# Patient Record
Sex: Male | Born: 1985 | Hispanic: No | Marital: Single | Smoking: Never smoker
Health system: Southern US, Community
[De-identification: ages and names within clinical notes are randomized; demographics above are authoritative.]

## PROBLEM LIST (undated history)

## (undated) DIAGNOSIS — G43909 Migraine, unspecified, not intractable, without status migrainosus: Secondary | ICD-10-CM

## (undated) DIAGNOSIS — K219 Gastro-esophageal reflux disease without esophagitis: Secondary | ICD-10-CM

## (undated) DIAGNOSIS — J45909 Unspecified asthma, uncomplicated: Secondary | ICD-10-CM

## (undated) DIAGNOSIS — J358 Other chronic diseases of tonsils and adenoids: Secondary | ICD-10-CM

## (undated) HISTORY — PX: OTHER SURGICAL HISTORY: SHX169

---
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
## 2006-03-14 ENCOUNTER — Emergency Department (HOSPITAL_COMMUNITY): Admission: EM | Admit: 2006-03-14 | Discharge: 2006-03-15 | Payer: Self-pay | Admitting: Emergency Medicine

## 2006-12-22 ENCOUNTER — Emergency Department (HOSPITAL_COMMUNITY): Admission: EM | Admit: 2006-12-22 | Discharge: 2006-12-22 | Payer: Self-pay | Admitting: Emergency Medicine

## 2007-07-26 ENCOUNTER — Emergency Department (HOSPITAL_COMMUNITY): Admission: EM | Admit: 2007-07-26 | Discharge: 2007-07-26 | Payer: Self-pay | Admitting: Emergency Medicine

## 2008-05-11 ENCOUNTER — Emergency Department (HOSPITAL_COMMUNITY): Admission: EM | Admit: 2008-05-11 | Discharge: 2008-05-11 | Payer: Self-pay | Admitting: Emergency Medicine

## 2008-07-11 ENCOUNTER — Emergency Department (HOSPITAL_COMMUNITY): Admission: EM | Admit: 2008-07-11 | Discharge: 2008-07-12 | Payer: Self-pay | Admitting: Emergency Medicine

## 2008-08-21 ENCOUNTER — Emergency Department (HOSPITAL_COMMUNITY): Admission: EM | Admit: 2008-08-21 | Discharge: 2008-08-21 | Payer: Self-pay | Admitting: Emergency Medicine

## 2008-08-21 ENCOUNTER — Emergency Department (HOSPITAL_COMMUNITY): Admission: EM | Admit: 2008-08-21 | Discharge: 2008-08-22 | Payer: Self-pay | Admitting: Internal Medicine

## 2010-06-11 LAB — POCT I-STAT, CHEM 8
BUN: 16 mg/dL (ref 6–23)
Chloride: 108 mEq/L (ref 96–112)
Potassium: 3.5 mEq/L (ref 3.5–5.1)

## 2010-06-11 LAB — POCT CARDIAC MARKERS
CKMB, poc: <1 ng/mL — ABNORMAL LOW (ref 1.0–8.0)
Myoglobin, poc: 38.4 ng/mL (ref 12–200)

## 2010-06-11 LAB — RAPID URINE DRUG SCREEN, HOSP PERFORMED
Amphetamines: NOT DETECTED
Barbiturates: NOT DETECTED
Cocaine: NOT DETECTED
Opiates: NOT DETECTED
Tetrahydrocannabinol: POSITIVE — AB

## 2010-12-12 LAB — URINALYSIS, ROUTINE W REFLEX MICROSCOPIC
Hgb urine dipstick: NEGATIVE
Ketones, ur: NEGATIVE
Protein, ur: NEGATIVE
Specific Gravity, Urine: >1.03 — ABNORMAL HIGH
Urobilinogen, UA: 0.2
pH: 6

## 2010-12-12 LAB — DIFFERENTIAL
Basophils Absolute: 0
Basophils Relative: 0
Lymphocytes Relative: 20
Monocytes Relative: 10
Neutro Abs: 5.4

## 2010-12-12 LAB — COMPREHENSIVE METABOLIC PANEL
Alkaline Phosphatase: 62
BUN: 9
Calcium: 9.2
Chloride: 101
GFR calc Af Amer: >60
Glucose, Bld: 102 — ABNORMAL HIGH
Potassium: 3.5
Sodium: 135

## 2010-12-12 LAB — CBC
HCT: 43.8
MCHC: 33.5
MCV: 79.2
Platelets: 279

## 2011-02-04 IMAGING — CR DG CHEST 2V
2 series · 2 of 2 positions shown · non-contrast
Comparison: 07/11/2008

CLINICAL DATA: Chest pain

CHEST - 2 VIEW

[w chest pa]
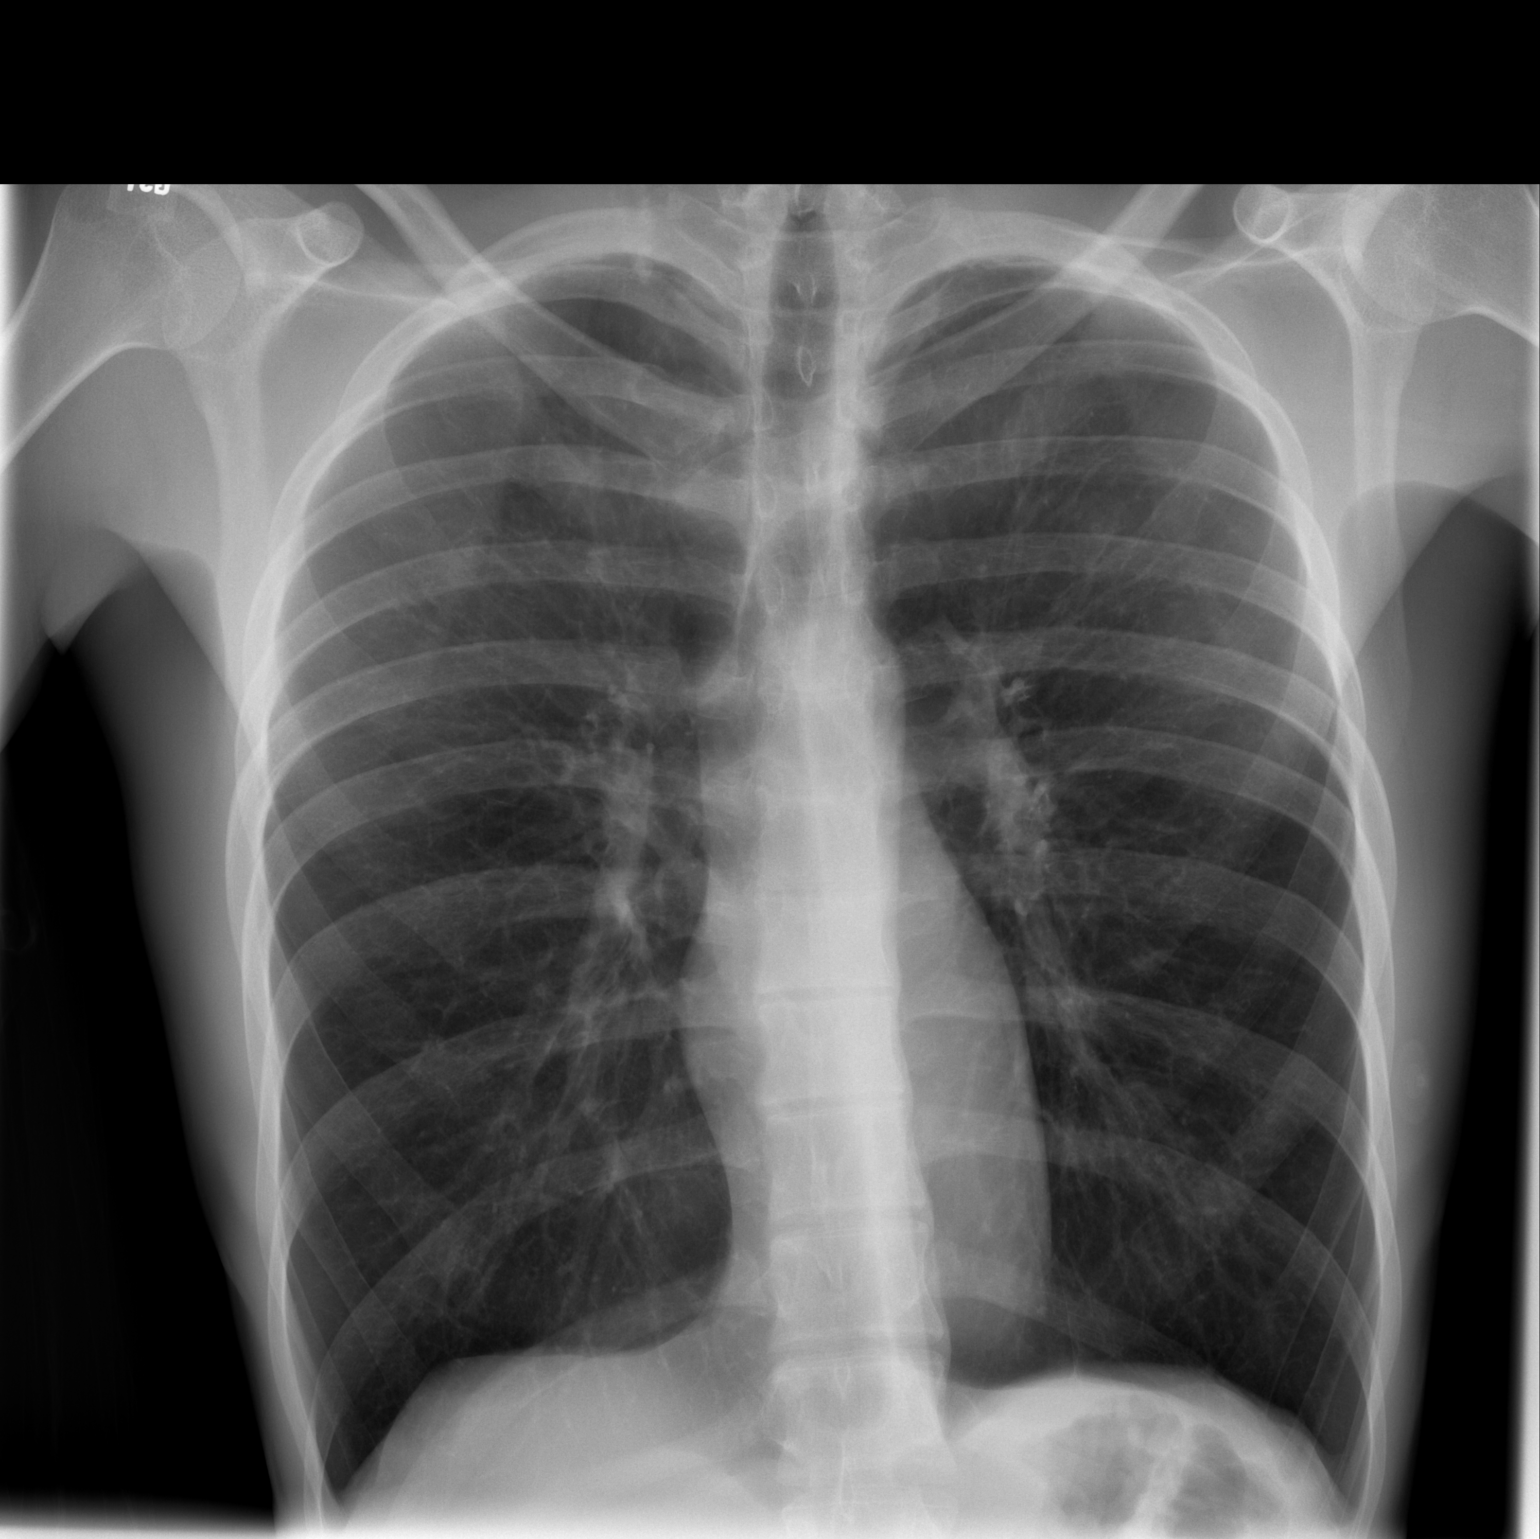

[w chest lat]
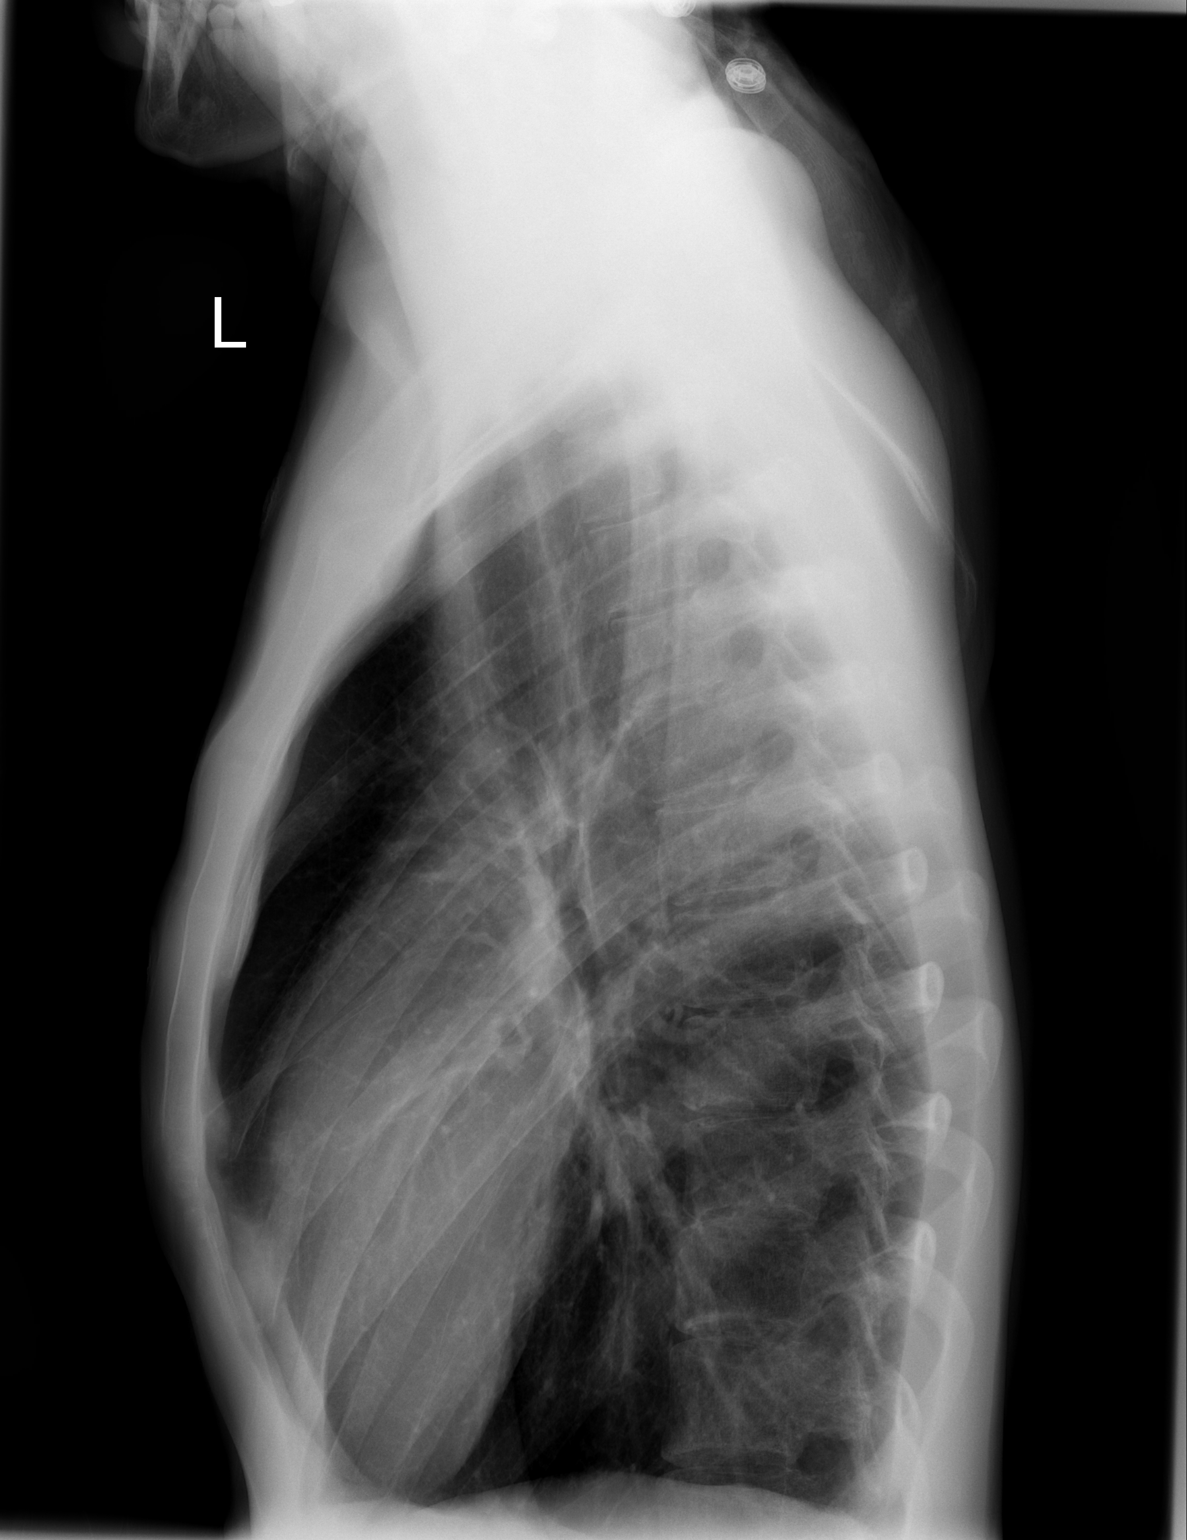

[2 of 2 positions shown; findings below may reference images not displayed]

FINDINGS: Marked hyperaeration.  Bronchitic changes.  No new
consolidation or mass.  No pneumothorax or pleural effusions.  The
heart is normal in size.
IMPRESSION: Stable hyperaeration and bronchitic changes.

## 2012-10-06 ENCOUNTER — Encounter (HOSPITAL_COMMUNITY): Payer: Self-pay

## 2012-10-06 ENCOUNTER — Emergency Department (HOSPITAL_COMMUNITY)
Admission: EM | Admit: 2012-10-06 | Discharge: 2012-10-06 | Disposition: A | Discharge status: Home / Self Care | Payer: Medicaid Other | Attending: Emergency Medicine | Admitting: Emergency Medicine

## 2012-10-06 DIAGNOSIS — N342 Other urethritis: Secondary | ICD-10-CM | POA: Insufficient documentation

## 2012-10-06 DIAGNOSIS — F17200 Nicotine dependence, unspecified, uncomplicated: Secondary | ICD-10-CM | POA: Insufficient documentation

## 2012-10-06 DIAGNOSIS — R3989 Other symptoms and signs involving the genitourinary system: Secondary | ICD-10-CM | POA: Insufficient documentation

## 2012-10-06 DIAGNOSIS — J45909 Unspecified asthma, uncomplicated: Secondary | ICD-10-CM | POA: Insufficient documentation

## 2012-10-06 HISTORY — DX: Unspecified asthma, uncomplicated: J45.909

## 2012-10-06 LAB — URINALYSIS, ROUTINE W REFLEX MICROSCOPIC
Bilirubin Urine: NEGATIVE
Glucose, UA: NEGATIVE mg/dL
Ketones, ur: NEGATIVE mg/dL
Leukocytes, UA: NEGATIVE
Nitrite: NEGATIVE

## 2012-10-06 MED ORDER — DOXYCYCLINE HYCLATE 100 MG PO CAPS: 100.0000 mg | ORAL_CAPSULE | Freq: Two times a day (BID) | ORAL | Status: DC

## 2012-10-06 NOTE — ED Provider Notes (Signed)
  CSN: [id number]     Arrival date & time 10/06/12  1431 History     None    Chief Complaint  Patient presents with  . Penile Discharge   (Consider location/radiation/quality/duration/timing/severity/associated sxs/prior Treatment) Patient is a 27 y.o. male presenting with penile discharge. The history is provided by the patient. No language interpreter was used.  Penile Discharge This is a new problem. The current episode started today. The problem occurs constantly. The problem has been unchanged. Nothing aggravates the symptoms. He has tried nothing for the symptoms. The treatment provided mild relief.  Pt has clear discharge,  Negative gc and ct [facility] on [facility].  Burning with urination  Past Medical History  Diagnosis Date  . Asthma    History reviewed. No pertinent past surgical history. No family history on file. History  Substance Use Topics  . Smoking status: Current Some Day Smoker    Types: Cigarettes  . Smokeless tobacco: Not on file  . Alcohol Use: Yes    Review of Systems  Genitourinary: Positive for discharge.  All other systems reviewed and are negative.    Allergies  Review of patient's allergies indicates no known allergies.  Home Medications   Current Outpatient Rx  Name  Route  Sig  Dispense  Refill  . albuterol (PROVENTIL HFA;VENTOLIN HFA) 108 (90 BASE) MCG/ACT inhaler   Inhalation   Inhale 2 puffs into the lungs every 6 (six) hours as needed for wheezing.         Marland Kitchen ibuprofen (ADVIL,MOTRIN) 200 MG tablet   Oral   Take 600 mg by mouth every 8 (eight) hours as needed for pain.          BP 138/88  Pulse 70  Temp(Src) 97.8 F (36.6 C) (Oral)  Resp 18  Ht 6' (1.829 m)  Wt 125 lb (56.7 kg)  BMI 16.95 kg/m2  SpO2 100% Physical Exam  Nursing note and vitals reviewed. Constitutional: He appears well-developed and well-nourished.  HENT:  Head: Normocephalic and atraumatic.  Right Ear: External ear normal.  Left Ear: External ear  normal.  Mouth/Throat: Oropharynx is clear and moist.  Eyes: Pupils are equal, round, and reactive to light.  Cardiovascular: Normal rate.   Pulmonary/Chest: Effort normal.  Abdominal: Soft.  Skin: Skin is warm.  Psychiatric: He has a normal mood and affect.    ED Course   Procedures (including critical care time)  Labs Reviewed - No data to display No results found. 1. Urethritis     MDM  Doxycycline   See the urologist if symptoms persit  Elson Areas, PA-C 10/06/12 1554

## 2012-10-06 NOTE — ED Notes (Signed)
Pt reports clear penile discharge for 5 days, thinks he may have gotten infection from "peeing in the river". Denies any fever/chills, no nausea. Was seen at [facility] last week and checked for std.

## 2012-10-06 NOTE — ED Notes (Signed)
Pt reports dysuria and clear penile discharge x1 week. Pt states he was swimming in river and urinated in the water and is worried about infection. Pt states he was seen at [facility] department for STD check and was told all tests were negative.

## 2012-10-07 NOTE — ED Provider Notes (Signed)
Medical screening examination/treatment/procedure(s) were performed by non-physician practitioner and as supervising physician I was immediately available for consultation/collaboration.  Flint Melter, MD 10/07/12 [phone]

## 2013-01-18 ENCOUNTER — Emergency Department (HOSPITAL_COMMUNITY)
Admission: EM | Admit: 2013-01-18 | Discharge: 2013-01-18 | Disposition: A | Discharge status: Home / Self Care | Payer: Medicaid Other | Attending: Emergency Medicine | Admitting: Emergency Medicine

## 2013-01-18 ENCOUNTER — Encounter (HOSPITAL_COMMUNITY): Payer: Self-pay | Admitting: Emergency Medicine

## 2013-01-18 DIAGNOSIS — J45909 Unspecified asthma, uncomplicated: Secondary | ICD-10-CM | POA: Insufficient documentation

## 2013-01-18 DIAGNOSIS — K029 Dental caries, unspecified: Secondary | ICD-10-CM | POA: Insufficient documentation

## 2013-01-18 DIAGNOSIS — R21 Rash and other nonspecific skin eruption: Secondary | ICD-10-CM | POA: Insufficient documentation

## 2013-01-18 DIAGNOSIS — Z79899 Other long term (current) drug therapy: Secondary | ICD-10-CM | POA: Insufficient documentation

## 2013-01-18 DIAGNOSIS — K130 Diseases of lips: Secondary | ICD-10-CM

## 2013-01-18 MED ORDER — CHLORHEXIDINE GLUCONATE 0.12 % MT SOLN: 15.0000 mL | Freq: Two times a day (BID) | OROMUCOSAL | Status: DC

## 2013-01-18 NOTE — ED Notes (Signed)
Pt says "rash" on lips that is getting worse. Pts doctor advised him to see dermatologist,Has appt for January.  Using carmax

## 2013-01-18 NOTE — ED Notes (Signed)
Pt states rash to corners of mouth which is beginning to spread on lip. Pt states he has been using caramex to the areas. First noticed symptoms in July.

## 2013-01-19 NOTE — ED Provider Notes (Signed)
CSN: [id number]     Arrival date & time 01/18/13  1055 History   First MD Initiated Contact with Patient 01/18/13 1111     Chief Complaint  Patient presents with  . mouth pain    (Consider location/radiation/quality/duration/timing/severity/associated sxs/prior Treatment) HPI Comments: Richard Clarke is a 27 y.o. male who presents to the Emergency Department complaining of persistent rash to the corners of his mouth and across his upper lip.  States sx's have been present for 4 months.  He has been using Carmex to his lips w/o improvement.  He denies fever, swelling, difficulty swallowing or breathing.  The history is provided by the patient.    Past Medical History  Diagnosis Date  . Asthma    History reviewed. No pertinent past surgical history. No family history on file. History  Substance Use Topics  . Smoking status: Never Smoker   . Smokeless tobacco: Not on file  . Alcohol Use: Yes     Comment: Occ    Review of Systems  Constitutional: Negative for fever and appetite change.  HENT: Positive for mouth sores. Negative for congestion, dental problem, facial swelling, sore throat and trouble swallowing.   Eyes: Negative for pain and visual disturbance.  Musculoskeletal: Negative for neck pain and neck stiffness.  Neurological: Negative for dizziness, facial asymmetry and headaches.  Hematological: Negative for adenopathy.  All other systems reviewed and are negative.    Allergies  Review of patient's allergies indicates no known allergies.  Home Medications   Current Outpatient Rx  Name  Route  Sig  Dispense  Refill  . albuterol (PROVENTIL HFA;VENTOLIN HFA) 108 (90 BASE) MCG/ACT inhaler   Inhalation   Inhale 2 puffs into the lungs every 6 (six) hours as needed for wheezing.         . chlorhexidine (PERIDEX) 0.12 % solution   Mouth/Throat   Use as directed 15 mLs in the mouth or throat 2 (two) times daily. Rinse for 30 seconds before spitting it out.   120  mL   0   . doxycycline (VIBRAMYCIN) 100 MG capsule   Oral   Take 1 capsule (100 mg total) by mouth 2 (two) times daily.   20 capsule   0   . ibuprofen (ADVIL,MOTRIN) 200 MG tablet   Oral   Take 600 mg by mouth every 8 (eight) hours as needed for pain.          BP 124/68  Pulse 57  Temp(Src) 98.7 F (37.1 C) (Oral)  Resp 16  SpO2 100% Physical Exam  Nursing note and vitals reviewed. Constitutional: He is oriented to person, place, and time. He appears well-developed and well-nourished. No distress.  HENT:  Head: Normocephalic and atraumatic.  Right Ear: Tympanic membrane and ear canal normal.  Left Ear: Tympanic membrane and ear canal normal.  Mouth/Throat: Uvula is midline, oropharynx is clear and moist and mucous membranes are normal. No trismus in the jaw. Dental caries present. No dental abscesses or uvula swelling.  Very scant lesions to the upper lips and along upper frenulum.  No erythema.  No oral lesions.  No wounds or erythema of the oral commissures.    No facial swelling, or obvious dental abscess  Neck: Normal range of motion. Neck supple.  Cardiovascular: Normal rate, regular rhythm and normal heart sounds.   No murmur heard. Pulmonary/Chest: Effort normal and breath sounds normal. No respiratory distress.  Musculoskeletal: Normal range of motion.  Lymphadenopathy:    He has no  cervical adenopathy.  Neurological: He is alert and oriented to person, place, and time. He exhibits normal muscle tone. Coordination normal.  Skin: Skin is warm and dry.    ED Course  Procedures (including critical care time) Labs Review Labs Reviewed - No data to display Imaging Review No results found.  EKG Interpretation   None       MDM   1. Angular cheilitis    Will have pt stop the carmex, referral for dermatology.  peridex mouth wash.  Pt is well appearing and stable for d/c    Tonia Avino L. Trisha Mangle, PA-C 01/19/13 2123

## 2013-01-21 NOTE — ED Provider Notes (Signed)
Medical screening examination/treatment/procedure(s) were performed by non-physician practitioner and as supervising physician I was immediately available for consultation/collaboration.  EKG Interpretation   None         Laurissa Cowper W Arnav Cregg, MD 01/21/13 1603 

## 2013-12-27 ENCOUNTER — Emergency Department (HOSPITAL_COMMUNITY)
Admission: EM | Admit: 2013-12-27 | Discharge: 2013-12-27 | Disposition: A | Discharge status: Home / Self Care | Payer: Medicaid Other | Attending: Emergency Medicine | Admitting: Emergency Medicine

## 2013-12-27 ENCOUNTER — Encounter (HOSPITAL_COMMUNITY): Payer: Self-pay | Admitting: Emergency Medicine

## 2013-12-27 DIAGNOSIS — J029 Acute pharyngitis, unspecified: Secondary | ICD-10-CM | POA: Insufficient documentation

## 2013-12-27 DIAGNOSIS — J45909 Unspecified asthma, uncomplicated: Secondary | ICD-10-CM | POA: Insufficient documentation

## 2013-12-27 MED ORDER — PENICILLIN V POTASSIUM 500 MG PO TABS: 500.0000 mg | ORAL_TABLET | Freq: Four times a day (QID) | ORAL | Status: AC

## 2013-12-27 MED ORDER — TRAMADOL HCL 50 MG PO TABS: 50.0000 mg | ORAL_TABLET | Freq: Four times a day (QID) | ORAL | Status: DC | PRN

## 2013-12-27 NOTE — ED Provider Notes (Signed)
CSN: [id number]     Arrival date & time 12/27/13  1759 History  This chart was scribed for non-physician practitioner Kerrie BuffaloHope Neese, NP, working with Vida RollerBrian D Miller, MD by Littie Deedsichard Sun, ED Scribe. This patient was seen in room [facility]FT21/[facility] and the patient's care was started at 6:41 PM.      Chief Complaint  Patient presents with  . Sore Throat    Patient is a 28 y.o. male presenting with pharyngitis. The history is provided by the patient. No language interpreter was used.  Sore Throat This is a new problem. The current episode started yesterday. The problem occurs constantly. The problem has been gradually worsening. Pertinent negatives include no chest pain, no abdominal pain and no headaches. The symptoms are aggravated by eating. He has tried nothing for the symptoms.  Sore Throat This is a new problem. The current episode started yesterday. The problem occurs constantly. The problem has been gradually worsening. Associated symptoms include a sore throat and swollen glands. Pertinent negatives include no abdominal pain, chest pain, congestion, coughing, fatigue, headaches, nausea, rash or vomiting. The symptoms are aggravated by eating. He has tried nothing for the symptoms.   HPI Comments: Richard Clarke is a 28 y.o. male who presents to the Emergency Department complaining of gradual onset, constant sore throat that began last night. Patient does report having a couple loose stools . He denies fever, chills, abomdinal pain, constipation, congestion. No allergies to penicillin.   Past Medical History  Diagnosis Date  . Asthma    History reviewed. No pertinent past surgical history. History reviewed. No pertinent family history. History  Substance Use Topics  . Smoking status: Never Smoker   . Smokeless tobacco: Not on file  . Alcohol Use: Yes     Comment: Occ    Review of Systems  Constitutional: Negative for appetite change and fatigue.  HENT: Positive for sore throat.  Negative for congestion, ear discharge, mouth sores, sinus pressure and trouble swallowing.   Eyes: Negative for discharge, redness and visual disturbance.  Respiratory: Negative for cough and wheezing.   Cardiovascular: Negative for chest pain.  Gastrointestinal: Negative for nausea, vomiting, abdominal pain and diarrhea.  Genitourinary: Negative for dysuria, frequency and hematuria.  Musculoskeletal: Negative for back pain.  Skin: Negative for rash.  Neurological: Negative for seizures and headaches.  Psychiatric/Behavioral: Negative for confusion.      Allergies  Review of patient's allergies indicates no known allergies.  Home Medications   Prior to Admission medications   Medication Sig Start Date End Date Taking? Authorizing Provider  albuterol (PROVENTIL HFA;VENTOLIN HFA) 108 (90 BASE) MCG/ACT inhaler Inhale 2 puffs into the lungs every 6 (six) hours as needed for wheezing.    Historical Provider, MD   BP 121/66  Pulse 61  Temp(Src) 98.5 F (36.9 C) (Oral)  Resp 24  Ht 5\' 9"  (1.753 m)  Wt 125 lb (56.7 kg)  BMI 18.45 kg/m2  SpO2 100% Physical Exam  Nursing note and vitals reviewed. Constitutional: He is oriented to person, place, and time. He appears well-developed and well-nourished. No distress.  HENT:  Head: Normocephalic and atraumatic.  Right Ear: Tympanic membrane normal.  Left Ear: Tympanic membrane normal.  Nose: Nose normal.  Mouth/Throat: Uvula is midline and mucous membranes are normal. Posterior oropharyngeal erythema present. No oropharyngeal exudate or tonsillar abscesses.  Tms clear, light reflex present. Uvula midline. No edema. Erythema throat.  Eyes: EOM are normal. Pupils are equal, round, and reactive to light. No scleral  icterus.  Sclera clear.  Neck: Normal range of motion. Neck supple. No spinous process tenderness present.  Anterior cervical adenopathy.  Cardiovascular: Normal rate, regular rhythm and normal heart sounds.   No murmur  heard. Pulmonary/Chest: Effort normal and breath sounds normal. No respiratory distress. He has no wheezes. He has no rales.  Abdominal: Soft. Bowel sounds are normal. There is no tenderness.  Musculoskeletal: Normal range of motion.  No CVA tenderness.  Lymphadenopathy:    He has cervical adenopathy.  Neurological: He is alert and oriented to person, place, and time. No cranial nerve deficit.  Skin: Skin is warm and dry. No rash noted.  Psychiatric: He has a normal mood and affect. His behavior is normal.    ED Course  Procedures  DIAGNOSTIC STUDIES: Oxygen Saturation is 100% on room air, normal by my interpretation.    COORDINATION OF CARE: 6:43 PM-Discussed treatment plan which includes abx and pain medication with pt at bedside and pt agreed to plan.     MDM  28 y.o. male with sore throat, gland swelling and feeling bad. Stable for discharge with symptoms of strep. Will treat with Penicillin. Discussed with the patient if symptoms worsen may consider testing for Mono. Will not test today since symptoms just started. Stable for discharge without neck stiffness or fever.    Medication List    TAKE these medications       penicillin v potassium 500 MG tablet  Commonly known as:  VEETID  Take 1 tablet (500 mg total) by mouth 4 (four) times daily.     traMADol 50 MG tablet  Commonly known as:  ULTRAM  Take 1 tablet (50 mg total) by mouth every 6 (six) hours as needed for moderate pain.      ASK your doctor about these medications       albuterol 108 (90 BASE) MCG/ACT inhaler  Commonly known as:  PROVENTIL HFA;VENTOLIN HFA  Inhale 2 puffs into the lungs every 6 (six) hours as needed for wheezing.         [facility] Orlene OchM Neese, [place]NP 12/27/13 [phone]

## 2013-12-27 NOTE — ED Notes (Signed)
Pt seen and eval by EDNP for initial assessment. 

## 2013-12-27 NOTE — ED Provider Notes (Signed)
Medical screening examination/treatment/procedure(s) were performed by non-physician practitioner and as supervising physician I was immediately available for consultation/collaboration.    Vida RollerBrian D Belvin Gauss, MD 12/27/13 [phone]

## 2013-12-27 NOTE — ED Notes (Signed)
Sore throat since last night. Alert, NAD.

## 2014-10-24 ENCOUNTER — Emergency Department (HOSPITAL_COMMUNITY)
Admission: EM | Admit: 2014-10-24 | Discharge: 2014-10-24 | Disposition: A | Discharge status: Home / Self Care | Payer: Medicaid Other | Attending: Emergency Medicine | Admitting: Emergency Medicine

## 2014-10-24 ENCOUNTER — Encounter (HOSPITAL_COMMUNITY): Payer: Self-pay | Admitting: *Deleted

## 2014-10-24 DIAGNOSIS — R519 Headache, unspecified: Secondary | ICD-10-CM

## 2014-10-24 DIAGNOSIS — J45909 Unspecified asthma, uncomplicated: Secondary | ICD-10-CM | POA: Insufficient documentation

## 2014-10-24 DIAGNOSIS — Z79899 Other long term (current) drug therapy: Secondary | ICD-10-CM | POA: Insufficient documentation

## 2014-10-24 DIAGNOSIS — R51 Headache: Secondary | ICD-10-CM | POA: Insufficient documentation

## 2014-10-24 MED ORDER — MAGIC MOUTHWASH W/LIDOCAINE: 5.0000 mL | Freq: Three times a day (TID) | ORAL | Status: DC | PRN

## 2014-10-24 NOTE — ED Notes (Signed)
Pt c/o rash around mouth and sores in mouth; pt states he went to dermatologist x 3 months ago and was given cream and he states that he does not think it is working; pt also states he feels like it has been affecting his overall health because he doesn't feel well

## 2014-10-24 NOTE — ED Provider Notes (Signed)
CSN: [id number]     Arrival date & time 10/24/14  1922 History  This chart was scribed for Raeford Razor, MD by Phillis Haggis, ED Scribe. This patient was seen in room APA19/[facility]19 and patient care was started at 8:14 PM.   Chief Complaint  Patient presents with  . Rash  The history is provided by the patient. No language interpreter was used.   HPI Comments: Richard Clarke is a 29 y.o. male with a hx of asthma who presents to the Emergency Department complaining of gradually worsening rash around mouth and sores inside mouth onset 4 months ago. He states that there are bumps on the lining of his left upper lip and there are open sores on the insides of his bilateral cheeks. He states that his mouth is constantly burning and itching and will intermittently cause his lips to swell. He states that the inside of his mouth "feels raw." Pt reports seeing his dermatologist 4 months ago and was given an antibiotic and cream but denies relief. He says that the dermatologist did not give him a diagnosis for what the rash was, but said it may be from albuterol inhaler use. Per triage note, pt states that he is not feeling well and believes the rash is affecting his overall health.   Past Medical History  Diagnosis Date  . Asthma    History reviewed. No pertinent past surgical history. History reviewed. No pertinent family history. Social History  Substance Use Topics  . Smoking status: Never Smoker   . Smokeless tobacco: None  . Alcohol Use: Yes     Comment: Occ    Review of Systems 10 Systems reviewed and all are negative for acute change except as noted in the HPI.  Allergies  Review of patient's allergies indicates no known allergies.  Home Medications   Prior to Admission medications   Medication Sig Start Date End Date Taking? Authorizing Provider  albuterol (PROVENTIL HFA;VENTOLIN HFA) 108 (90 BASE) MCG/ACT inhaler Inhale 2 puffs into the lungs every 6 (six) hours as needed for  wheezing.    Historical Provider, MD  traMADol (ULTRAM) 50 MG tablet Take 1 tablet (50 mg total) by mouth every 6 (six) hours as needed for moderate pain. 12/27/13   Hope Orlene Och, NP   BP 129/78 mmHg  Pulse 70  Temp(Src) 98.2 F (36.8 C) (Oral)  Resp 20  Ht  (1.753 m)  Wt 135 lb (61.236 kg)  BMI 19.93 kg/m2  SpO2 99%  Physical Exam  Constitutional: He is oriented to person, place, and time. He appears well-developed and well-nourished.  HENT:  Head: Normocephalic and atraumatic.  Mouth/Throat: Oropharynx is clear and moist.  Face is normal in appearance; no swelling or other skin changes noted  Eyes: EOM are normal.  Neck: Normal range of motion. Neck supple.  Cardiovascular: Normal rate.   Pulmonary/Chest: Effort normal.  Musculoskeletal: Normal range of motion.  Neurological: He is alert and oriented to person, place, and time.  Skin: Skin is warm and dry.  Psychiatric: He has a normal mood and affect. His behavior is normal.  Nursing note and vitals reviewed.   ED Course  Procedures (including critical care time) DIAGNOSTIC STUDIES: Oxygen Saturation is 99% on RA, normal by my interpretation.    COORDINATION OF CARE: 8:22 PM-Discussed treatment plan which includes prescription with pt at bedside and pt agreed to plan.   Labs Review Labs Reviewed - No data to display  Imaging Review No results  found.    EKG Interpretation None      MDM   Final diagnoses:  Facial pain, acute    29yM with facial/oral pain. I do not find anything objective on my exam. No facial swelling. Mild facial acne but otherwise no oral or perioral lesions noted.   I personally preformed the services scribed in my presence. The recorded information has been reviewed is accurate. Raeford Razor, MD.   Raeford Razor, MD 10/26/14 [phone]

## 2014-10-24 NOTE — ED Notes (Signed)
Patient states that he was upset that the doctor came in with a male when he examined him. States that he wasted his time coming here and is going to a different hospital because doctor states he didn't find anything.  Patient left without signing discharge paperwork or getting paper work.

## 2015-08-08 ENCOUNTER — Emergency Department (HOSPITAL_COMMUNITY)
Admission: EM | Admit: 2015-08-08 | Discharge: 2015-08-08 | Disposition: A | Discharge status: Home / Self Care | Payer: 59 | Attending: Emergency Medicine | Admitting: Emergency Medicine

## 2015-08-08 ENCOUNTER — Encounter (HOSPITAL_COMMUNITY): Payer: Self-pay | Admitting: *Deleted

## 2015-08-08 ENCOUNTER — Emergency Department (HOSPITAL_COMMUNITY): Payer: 59

## 2015-08-08 DIAGNOSIS — R1031 Right lower quadrant pain: Secondary | ICD-10-CM | POA: Insufficient documentation

## 2015-08-08 DIAGNOSIS — Z79899 Other long term (current) drug therapy: Secondary | ICD-10-CM | POA: Insufficient documentation

## 2015-08-08 DIAGNOSIS — R109 Unspecified abdominal pain: Secondary | ICD-10-CM | POA: Diagnosis present

## 2015-08-08 DIAGNOSIS — R1032 Left lower quadrant pain: Secondary | ICD-10-CM | POA: Insufficient documentation

## 2015-08-08 DIAGNOSIS — J45909 Unspecified asthma, uncomplicated: Secondary | ICD-10-CM | POA: Insufficient documentation

## 2015-08-08 LAB — COMPREHENSIVE METABOLIC PANEL
ALBUMIN: 3.9 g/dL (ref 3.5–5.0)
ALK PHOS: 40 U/L (ref 38–126)
ALT: 15 U/L — ABNORMAL LOW (ref 17–63)
ANION GAP: 5 (ref 5–15)
AST: 22 U/L (ref 15–41)
BUN: 10 mg/dL (ref 6–20)
CALCIUM: 9.4 mg/dL (ref 8.9–10.3)
CO2: 26 mmol/L (ref 22–32)
Chloride: 107 mmol/L (ref 101–111)
Creatinine, Ser: 1.04 mg/dL (ref 0.61–1.24)
GFR calc non Af Amer: >60 mL/min (ref >60)
GLUCOSE: 98 mg/dL (ref 65–99)
POTASSIUM: 3.7 mmol/L (ref 3.5–5.1)
SODIUM: 138 mmol/L (ref 135–145)
Total Bilirubin: 0.8 mg/dL (ref 0.3–1.2)
Total Protein: 6.9 g/dL (ref 6.5–8.1)

## 2015-08-08 LAB — LIPASE, BLOOD: LIPASE: 24 U/L (ref 11–51)

## 2015-08-08 LAB — CBC
HEMATOCRIT: 41.8 % (ref 39.0–52.0)
HEMOGLOBIN: 13.4 g/dL (ref 13.0–17.0)
MCH: 26.3 pg (ref 26.0–34.0)
MCHC: 32.1 g/dL (ref 30.0–36.0)
MCV: 82 fL (ref 78.0–100.0)
Platelets: 266 10*3/uL (ref 150–400)
RBC: 5.1 MIL/uL (ref 4.22–5.81)
RDW: 13.4 % (ref 11.5–15.5)
WBC: 8.8 10*3/uL (ref 4.0–10.5)

## 2015-08-08 LAB — URINALYSIS, ROUTINE W REFLEX MICROSCOPIC
Bilirubin Urine: NEGATIVE
Glucose, UA: NEGATIVE mg/dL
Hgb urine dipstick: NEGATIVE
Ketones, ur: NEGATIVE mg/dL
Leukocytes, UA: NEGATIVE
NITRITE: NEGATIVE
PH: 6 (ref 5.0–8.0)
Protein, ur: NEGATIVE mg/dL
SPECIFIC GRAVITY, URINE: 1.024 (ref 1.005–1.030)

## 2015-08-08 MED ORDER — IOPAMIDOL (ISOVUE-300) INJECTION 61%
INTRAVENOUS | Status: AC
  Administered 2015-08-08: 100 mL
  Filled 2015-08-08: qty 100

## 2015-08-08 MED ORDER — ONDANSETRON HCL 4 MG/2ML IJ SOLN
4.0000 mg | Freq: Once | INTRAMUSCULAR | Status: AC
  Administered 2015-08-08: 4 mg via INTRAVENOUS
  Filled 2015-08-08: qty 2

## 2015-08-08 MED ORDER — SODIUM CHLORIDE 0.9 % IV BOLUS (SEPSIS)
1000.0000 mL | Freq: Once | INTRAVENOUS | Status: AC
  Administered 2015-08-08: 1000 mL via INTRAVENOUS

## 2015-08-08 MED ORDER — MORPHINE SULFATE (PF) 4 MG/ML IV SOLN
4.0000 mg | Freq: Once | INTRAVENOUS | Status: AC
  Administered 2015-08-08: 4 mg via INTRAVENOUS
  Filled 2015-08-08: qty 1

## 2015-08-08 NOTE — ED Notes (Signed)
Brought patient back to room; patient undressed, in gown, on continuous pulse oximetry and blood pressure cuff 

## 2015-08-08 NOTE — ED Notes (Signed)
Pt is here with LLQ pain for 3 months intermittently and now constant.  Reports some diarrhea

## 2015-08-08 NOTE — Discharge Instructions (Signed)

## 2015-08-08 NOTE — ED Provider Notes (Signed)
CSN: [id number]     Arrival date & time 08/08/15  1300 History   First MD Initiated Contact with Patient 08/08/15 1556     Chief Complaint  Patient presents with  . Abdominal Pain     (Consider location/radiation/quality/duration/timing/severity/associated sxs/prior Treatment) HPI Comments: Patient presents for abdominal pain. He reports pain to his left lower quadrant off and on for about the last 4 months. He states it normally happens once or twice a week. It normally only last a couple of hours. He states since yesterday it's been hurting constantly and is been worsening throughout the day. He has occasional loose stools. No nausea or vomiting. No fevers. He denies any past abdominal surgeries. No urinary problems. He describes as a constant achy pain. Occasionally radiates to his left back.  Patient is a 30 y.o. male presenting with abdominal pain.  Abdominal Pain Associated symptoms: no chest pain, no chills, no cough, no diarrhea, no fatigue, no fever, no hematuria, no nausea, no shortness of breath and no vomiting     Past Medical History  Diagnosis Date  . Asthma    History reviewed. No pertinent past surgical history. No family history on file. Social History  Substance Use Topics  . Smoking status: Never Smoker   . Smokeless tobacco: None  . Alcohol Use: Yes     Comment: Occ    Review of Systems  Constitutional: Negative for fever, chills, diaphoresis and fatigue.  HENT: Negative for congestion, rhinorrhea and sneezing.   Eyes: Negative.   Respiratory: Negative for cough, chest tightness and shortness of breath.   Cardiovascular: Negative for chest pain and leg swelling.  Gastrointestinal: Positive for abdominal pain. Negative for nausea, vomiting, diarrhea and blood in stool.  Genitourinary: Negative for frequency, hematuria, flank pain and difficulty urinating.  Musculoskeletal: Negative for back pain and arthralgias.  Skin: Negative for rash.  Neurological:  Negative for dizziness, speech difficulty, weakness, numbness and headaches.      Allergies  Levaquin  Home Medications   Prior to Admission medications   Medication Sig Start Date End Date Taking? Authorizing Provider  acetaminophen (TYLENOL) 500 MG tablet Take 500-1,000 mg by mouth every 6 (six) hours as needed for mild pain.   Yes Historical Provider, MD  acyclovir (ZOVIRAX) 200 MG capsule Take 2 capsules by mouth 3 (three) times daily as needed (for flares).  08/01/15  Yes Historical Provider, MD  albuterol (PROVENTIL HFA;VENTOLIN HFA) 108 (90 BASE) MCG/ACT inhaler Inhale 2 puffs into the lungs every 6 (six) hours as needed for wheezing.   Yes Historical Provider, MD  Alum & Mag Hydroxide-Simeth (MAGIC MOUTHWASH W/LIDOCAINE) SOLN Take 5 mLs by mouth 3 (three) times daily as needed for mouth pain (swish & spit.). 10/24/14   Raeford Razor, MD  traMADol (ULTRAM) 50 MG tablet Take 1 tablet (50 mg total) by mouth every 6 (six) hours as needed for moderate pain. 12/27/13   Hope Orlene Och, NP   BP 128/90 mmHg  Pulse 53  Temp(Src) 98 F (36.7 C) (Oral)  Resp 18  SpO2 100% Physical Exam  Constitutional: He is oriented to person, place, and time. He appears well-developed and well-nourished.  HENT:  Head: Normocephalic and atraumatic.  Eyes: Pupils are equal, round, and reactive to light.  Neck: Normal range of motion. Neck supple.  Cardiovascular: Normal rate, regular rhythm and normal heart sounds.   Pulmonary/Chest: Effort normal and breath sounds normal. No respiratory distress. He has no wheezes. He has no rales. He exhibits  no tenderness.  Abdominal: Soft. Bowel sounds are normal. There is tenderness (Positive marked tenderness to the right lower quadrant. There some moderate tenderness to the left lower quadrant. No CVA tenderness.). There is no rebound and no guarding.  Musculoskeletal: Normal range of motion. He exhibits no edema.  Lymphadenopathy:    He has no cervical adenopathy.   Neurological: He is alert and oriented to person, place, and time.  Skin: Skin is warm and dry. No rash noted.  Psychiatric: He has a normal mood and affect.    ED Course  Procedures (including critical care time) Labs Review Labs Reviewed  COMPREHENSIVE METABOLIC PANEL - Abnormal; Notable for the following:    ALT 15 (*)    All other components within normal limits  LIPASE, BLOOD  CBC  URINALYSIS, ROUTINE W REFLEX MICROSCOPIC (NOT [facility])    Imaging Review Ct Abdomen Pelvis W Contrast  08/08/2015  CLINICAL DATA:  RIGHT lower quadrant and LEFT lower quadrant pain 4 months. EXAM: CT ABDOMEN AND PELVIS WITH CONTRAST TECHNIQUE: Multidetector CT imaging of the abdomen and pelvis was performed using the standard protocol following bolus administration of intravenous contrast. CONTRAST:  100mL ISOVUE-300 IOPAMIDOL (ISOVUE-300) INJECTION 61% COMPARISON:  None. FINDINGS: Lower chest: Lung bases are clear. Hepatobiliary: No focal hepatic lesion. No biliary duct dilatation. Gallbladder is normal. Common bile duct is normal. Pancreas: Pancreas is normal. No ductal dilatation. No pancreatic inflammation. Spleen: Normal spleen Adrenals/urinary tract: Adrenal glands and kidneys are normal. The ureters and bladder normal. Stomach/Bowel: Stomach, small bowel, appendix, and cecum are normal. The colon and rectosigmoid colon are normal. Vascular/Lymphatic: Abdominal aorta is normal caliber. There is no retroperitoneal or periportal lymphadenopathy. No pelvic lymphadenopathy. Reproductive: Prostate normal Other: No free fluid. Musculoskeletal: No aggressive osseous lesion. Transitional anatomy on the LEFT with mild scoliosis. IMPRESSION: 1. Normal appendix. 2. Normal bowel. 3. No evidence inflammation or infection in the abdomen pelvis. Electronically Signed   By: Genevive BiStewart  Edmunds M.D.   On: 08/08/2015 18:08   I have personally reviewed and evaluated these images and lab results as part of my medical  decision-making.   EKG Interpretation None      MDM   Final diagnoses:  Abdominal pain, unspecified abdominal location    Patient presents with lower abdominal pain. He had tenderness to his right lower quadrant. His CT was normal. Normal appendix. No evidence of obstruction. No evidence of kidney stone. His urine is clean without evidence of infection. His blood work was otherwise unremarkable. He was discharged home in good condition. He was advised to use a clear liquid diet for the next 48 hours and then slowly progress after that. He is encouraged to make a follow-up appointment with his PCP if his symptoms do not improve or return here as needed for any worsening symptoms.    Rolan BuccoMelanie Tanaka Gillen, MD 08/08/15 [phone]

## 2015-08-08 NOTE — ED Notes (Signed)
Patient Alert and oriented X4. Stable and ambulatory. Patient verbalized understanding of the discharge instructions.  Patient belongings were taken by the patient.  

## 2016-03-09 ENCOUNTER — Emergency Department (HOSPITAL_COMMUNITY): Payer: 59

## 2016-03-09 ENCOUNTER — Emergency Department (HOSPITAL_COMMUNITY)
Admission: EM | Admit: 2016-03-09 | Discharge: 2016-03-09 | Disposition: A | Discharge status: Home / Self Care | Payer: 59 | Attending: Emergency Medicine | Admitting: Emergency Medicine

## 2016-03-09 ENCOUNTER — Encounter (HOSPITAL_COMMUNITY): Payer: Self-pay | Admitting: *Deleted

## 2016-03-09 DIAGNOSIS — J309 Allergic rhinitis, unspecified: Secondary | ICD-10-CM | POA: Diagnosis not present

## 2016-03-09 DIAGNOSIS — N342 Other urethritis: Secondary | ICD-10-CM | POA: Diagnosis not present

## 2016-03-09 DIAGNOSIS — R300 Dysuria: Secondary | ICD-10-CM | POA: Diagnosis present

## 2016-03-09 MED ORDER — LIDOCAINE HCL (PF) 1 % IJ SOLN
INTRAMUSCULAR | Status: AC
  Administered 2016-03-09: 5 mL
  Filled 2016-03-09: qty 5

## 2016-03-09 MED ORDER — DOXYCYCLINE HYCLATE 100 MG PO CAPS: 100.0000 mg | ORAL_CAPSULE | Freq: Two times a day (BID) | ORAL | 0 refills | Status: DC

## 2016-03-09 MED ORDER — FLUTICASONE PROPIONATE 50 MCG/ACT NA SUSP: 2.0000 | Freq: Every day | NASAL | 0 refills | Status: DC

## 2016-03-09 MED ORDER — CEFTRIAXONE SODIUM 250 MG IJ SOLR
250.0000 mg | Freq: Once | INTRAMUSCULAR | Status: AC
  Administered 2016-03-09: 250 mg via INTRAMUSCULAR
  Filled 2016-03-09: qty 250

## 2016-03-09 NOTE — Discharge Instructions (Signed)
All sexual partners need to be evaluated and treated.

## 2016-03-09 NOTE — ED Notes (Signed)
Report given to Meagan Mitchell 

## 2016-03-09 NOTE — ED Provider Notes (Signed)
[facility]-EMER[facility]ENCY DEPT Provider Note   CSN: [id number] Arrival date & time: 03/09/16  1058     History   Chief Complaint Chief Complaint  Patient presents with  . Groin Pain    HPI Richard Rummagenthony M Mccartin is a 31 y.o. male.  HPI Patient has 2 days of purulent discharge that is thick and yellow. Also complains of dysuria. No rash or masses. No testicular pain. Denies any abdominal pain, nausea or vomiting. No fever or chills. Patient sexually active but does not use condoms.   Patient has had one week of nasal congestion, sore throat and coughing and occasional wheezing. He's taking an unknown antihistamine at home. Past Medical History:  Diagnosis Date  . Asthma     There are no active problems to display for this patient.   History reviewed. No pertinent surgical history.     Home Medications    Prior to Admission medications   Medication Sig Start Date End Date Taking? Authorizing Provider  acetaminophen (TYLENOL) 500 MG tablet Take 500-1,000 mg by mouth every 6 (six) hours as needed for mild pain.   Yes Historical Provider, MD  albuterol (PROVENTIL HFA;VENTOLIN HFA) 108 (90 BASE) MCG/ACT inhaler Inhale 2 puffs into the lungs every 6 (six) hours as needed for wheezing.   Yes Historical Provider, MD  Pseudoeph-Doxylamine-DM-APAP (NYQUIL PO) Take 30 mLs by mouth at bedtime as needed (cold symptoms).   Yes Historical Provider, MD  doxycycline (VIBRAMYCIN) 100 MG capsule Take 1 capsule (100 mg total) by mouth 2 (two) times daily. One po bid x 7 days 03/09/16   Loren Raceravid Burnie Hank, MD  fluticasone [facility](FLONASE) 50 MCG/ACT nasal spray Place 2 sprays into both nostrils daily. 03/09/16   Loren Raceravid Harlee Pursifull, MD    Family History No family history on file.  Social History Social History  Substance Use Topics  . Smoking status: Never Smoker  . Smokeless tobacco: Never Used  . Alcohol use Yes     Comment: Occ     Allergies   Levaquin [levofloxacin]   Review of Systems Review of Systems    Constitutional: Negative for chills and fever.  HENT: Positive for congestion, sinus pressure and sore throat.   Eyes: Negative for visual disturbance.  Respiratory: Positive for cough and wheezing. Negative for shortness of breath.   Cardiovascular: Negative for chest pain and leg swelling.  Gastrointestinal: Negative for abdominal pain, diarrhea, nausea and vomiting.  Genitourinary: Positive for discharge and dysuria. Negative for difficulty urinating, flank pain, frequency, hematuria, penile pain, penile swelling, scrotal swelling, testicular pain and urgency.  Musculoskeletal: Negative for arthralgias, back pain, myalgias and neck pain.  Skin: Negative for rash and wound.  Neurological: Negative for dizziness, weakness, light-headedness, numbness and headaches.  All other systems reviewed and are negative.    Physical Exam Updated Vital Signs BP 118/78   Pulse 68   Temp 97.7 F (36.5 C) (Oral)   Resp 16   Ht 5\' 9"  (1.753 m)   Wt 134 lb (60.8 kg)   SpO2 100%   BMI 19.79 kg/m   Physical Exam  Constitutional: He is oriented to person, place, and time. He appears well-developed and well-nourished. No distress.  HENT:  Head: Normocephalic and atraumatic.  Mouth/Throat: Oropharynx is clear and moist.  Bilateral nasal mucosal edema. No sinus tenderness with percussion. Oropharynx is mildly erythematous. No exudates. Uvula is midline  Eyes: EOM are normal. Pupils are equal, round, and reactive to light. Right eye exhibits no discharge. Left eye exhibits no discharge.  Neck: Normal range of motion. Neck supple.  Cardiovascular: Normal rate and regular rhythm.   Pulmonary/Chest: Effort normal and breath sounds normal. No respiratory distress. He has no wheezes. He has no rales. He exhibits no tenderness.  Abdominal: Soft. Bowel sounds are normal. There is no tenderness. There is no rebound and no guarding.  Genitourinary:  Genitourinary Comments: Thick purulent yellow penile  discharge. No testicular swelling or pain. No lymphadenopathy. No rashes.   Musculoskeletal: Normal range of motion. He exhibits no edema or tenderness.  No CVA tenderness. No lower extremity swelling or asymmetry.  Lymphadenopathy:    He has no cervical adenopathy.  Neurological: He is alert and oriented to person, place, and time.  Moving all extremities without deficit. Sensation is fully intact.  Skin: Skin is warm. Capillary refill takes less than 2 seconds. No rash noted. He is not diaphoretic. No erythema.  Psychiatric: He has a normal mood and affect. His behavior is normal.  Nursing note and vitals reviewed.    ED Treatments / Results  Labs (all labs ordered are listed, but only abnormal results are displayed) Labs Reviewed  GC/CHLAMYDIA PROBE AMP ([place]) NOT AT [facility]    EKG  EKG Interpretation None       Radiology Dg Chest 2 View  Result Date: 03/09/2016 CLINICAL DATA:  Cough EXAM: CHEST  2 VIEW COMPARISON:  08/21/2008 FINDINGS: The heart size and mediastinal contours are within normal limits. Both lungs are clear. The visualized skeletal structures are unremarkable. IMPRESSION: No active cardiopulmonary disease. Electronically Signed   By: Alcide Clever M.D.   On: 03/09/2016 13:11    Procedures Procedures (including critical care time)  Medications Ordered in ED Medications  cefTRIAXone (ROCEPHIN) injection 250 mg (250 mg Intramuscular Given 03/09/16 1313)  lidocaine (PF) (XYLOCAINE) 1 % injection (5 mLs  Given 03/09/16 1313)     Initial Impression / Assessment and Plan / ED Course  I have reviewed the triage vital signs and the nursing notes.  Pertinent labs & imaging results that were available during my care of the patient were reviewed by me and considered in my medical decision making (see chart for details).  Clinical Course     Given IM Rocephin and discharged with a course of doxycycline. Advised to have all sexual partners evaluated and treated.  We'll also start on Flonase for allergic rhinitis and sinus congestion. Advised to follow-up with his primary physician. Return precautions given.  Final Clinical Impressions(s) / ED Diagnoses   Final diagnoses:  Urethritis  Allergic rhinitis, unspecified chronicity, unspecified seasonality, unspecified trigger    New Prescriptions New Prescriptions   DOXYCYCLINE (VIBRAMYCIN) 100 MG CAPSULE    Take 1 capsule (100 mg total) by mouth 2 (two) times daily. One po bid x 7 days   FLUTICASONE (FLONASE) 50 MCG/ACT NASAL SPRAY    Place 2 sprays into both nostrils daily.     Loren Racer, MD 03/09/16 [phone]

## 2016-03-09 NOTE — ED Triage Notes (Signed)
Pt reports sharp pain in left groin area that started 2 days ago. Denies injury.

## 2016-03-09 NOTE — ED Notes (Signed)
Pt made aware to return if symptoms worsen or if any life threatening symptoms occur.   

## 2016-03-11 LAB — GC/CHLAMYDIA PROBE AMP (~~LOC~~) NOT AT ARMC
Chlamydia: POSITIVE — AB
NEISSERIA GONORRHEA: POSITIVE — AB

## 2016-07-15 ENCOUNTER — Encounter: Payer: Self-pay | Admitting: Gastroenterology

## 2016-08-09 ENCOUNTER — Telehealth: Payer: Self-pay | Admitting: Gastroenterology

## 2016-08-09 ENCOUNTER — Ambulatory Visit: Payer: 59 | Admitting: Gastroenterology

## 2016-08-09 ENCOUNTER — Encounter: Payer: Self-pay | Admitting: Gastroenterology

## 2016-08-09 NOTE — Telephone Encounter (Signed)
PT WAS A NO SHOW AND LETTER SENT  °

## 2017-01-15 ENCOUNTER — Encounter: Payer: Self-pay | Admitting: Gastroenterology

## 2017-03-12 ENCOUNTER — Ambulatory Visit (INDEPENDENT_AMBULATORY_CARE_PROVIDER_SITE_OTHER): Payer: BLUE CROSS/BLUE SHIELD | Admitting: Gastroenterology

## 2017-03-12 ENCOUNTER — Encounter: Payer: Self-pay | Admitting: Gastroenterology

## 2017-03-12 VITALS — BP 104/60 | HR 51 | Temp 97.0°F | Ht 69.0 in | Wt 134.2 lb

## 2017-03-12 DIAGNOSIS — R1013 Epigastric pain: Secondary | ICD-10-CM | POA: Diagnosis not present

## 2017-03-12 DIAGNOSIS — K219 Gastro-esophageal reflux disease without esophagitis: Secondary | ICD-10-CM

## 2017-03-12 DIAGNOSIS — G8929 Other chronic pain: Secondary | ICD-10-CM | POA: Diagnosis not present

## 2017-03-12 MED ORDER — PANTOPRAZOLE SODIUM 40 MG PO TBEC: 40.0000 mg | DELAYED_RELEASE_TABLET | Freq: Every day | ORAL | 3 refills | Status: DC

## 2017-03-12 NOTE — Patient Instructions (Addendum)
Stop Prilosec for now. Start taking Protonix once each morning, at least 30 minutes before breakfast. These medications work best on a completely empty stomach.  I have included a handout on what foods to avoid and different ways to decrease symptoms related to reflux.   Please call in 2 weeks with an update.   We will see you in 2-3 months!  It was a pleasure to see you today. I strive to create trusting relationships with patients to provide genuine, compassionate, and quality care. I value your feedback. If you receive a survey regarding your visit,  I greatly appreciate you the taking time to fill this out.   Gelene MinkAnna W. Boone, PhD, ANP-BC [facility]    Food Choices for Gastroesophageal Reflux Disease, Adult When you have gastroesophageal reflux disease (GERD), the foods you eat and your eating habits are very important. Choosing the right foods can help ease the discomfort of GERD. Consider working with a diet and nutrition specialist (dietitian) to help you make healthy food choices. What general guidelines should I follow? Eating plan  Choose healthy foods low in fat, such as fruits, vegetables, whole grains, low-fat dairy products, and lean meat, fish, and poultry.  Eat frequent, small meals instead of three large meals each day. Eat your meals slowly, in a relaxed setting. Avoid bending over or lying down until 2-3 hours after eating.  Limit high-fat foods such as fatty meats or fried foods.  Limit your intake of oils, butter, and shortening to less than 8 teaspoons each day.  Avoid the following: ? Foods that cause symptoms. These may be different for different people. Keep a food diary to keep track of foods that cause symptoms. ? Alcohol. ? Drinking large amounts of liquid with meals. ? Eating meals during the 2-3 hours before bed.  Cook foods using methods other than frying. This may include baking, grilling, or broiling. Lifestyle   Maintain a healthy  weight. Ask your health care provider what weight is healthy for you. If you need to lose weight, work with your health care provider to do so safely.  Exercise for at least 30 minutes on 5 or more days each week, or as told by your health care provider.  Avoid wearing clothes that fit tightly around your waist and chest.  Do not use any products that contain nicotine or tobacco, such as cigarettes and e-cigarettes. If you need help quitting, ask your health care provider.  Sleep with the head of your bed raised. Use a wedge under the mattress or blocks under the bed frame to raise the head of the bed. What foods are not recommended? The items listed may not be a complete list. Talk with your dietitian about what dietary choices are best for you. Grains Pastries or quick breads with added fat. [place] toast. Vegetables Deep fried vegetables. [place] fries. Any vegetables prepared with added fat. Any vegetables that cause symptoms. For some people this may include tomatoes and tomato products, chili peppers, onions and garlic, and horseradish. Fruits Any fruits prepared with added fat. Any fruits that cause symptoms. For some people this may include citrus fruits, such as oranges, grapefruit, pineapple, and lemons. Meats and other protein foods High-fat meats, such as fatty beef or pork, hot dogs, ribs, ham, sausage, salami and bacon. Fried meat or protein, including fried fish and fried chicken. Nuts and nut butters. Dairy Whole milk and chocolate milk. Sour cream. Cream. Ice cream. Cream cheese. Milk shakes. Beverages Coffee and tea,  with or without caffeine. Carbonated beverages. Sodas. Energy drinks. Fruit juice made with acidic fruits (such as orange or grapefruit). Tomato juice. Alcoholic drinks. Fats and oils Butter. Margarine. Shortening. Ghee. Sweets and desserts Chocolate and cocoa. Donuts. Seasoning and other foods Pepper. Peppermint and spearmint. Any condiments, herbs, or  seasonings that cause symptoms. For some people, this may include curry, hot sauce, or vinegar-based salad dressings. Summary  When you have gastroesophageal reflux disease (GERD), food and lifestyle choices are very important to help ease the discomfort of GERD.  Eat frequent, small meals instead of three large meals each day. Eat your meals slowly, in a relaxed setting. Avoid bending over or lying down until 2-3 hours after eating.  Limit high-fat foods such as fatty meat or fried foods. This information is not intended to replace advice given to you by your health care provider. Make sure you discuss any questions you have with your health care provider. Document Released: 02/18/2005 Document Revised: 02/20/2016 Document Reviewed: 02/20/2016 Elsevier Interactive Patient Education  [employer].

## 2017-03-12 NOTE — Progress Notes (Signed)
Primary Care Physician:  Juliette AlcideBurdine, Steven E, MD Primary Gastroenterologist:  Dr. Darrick PennaFields   Chief Complaint  Patient presents with  . Abdominal Pain    LLQ x 2 years  . Gastroesophageal Reflux    HPI:   Richard Clarke is a 32 y.o. male presenting today at the request of Burdine, Ananias PilgrimSteven E, MD due to abdominal pain and GERD.    Pain started 2 years ago, waxing and waning in intensity. Eats as soon as he wakes up. States if he doesn't eat in the morning, has no energy. Stomach growling. If doesn't eat before going to bed, will have abdominal pain. Constantly feels like there is air in his stomach. Eats large amounts of food then hungry again. BM 1-2 times per day. Sometimes postprandial urgency. Denies diarrhea. Stomach sounds hyperactive. Doesn't understand why his stomach always feels empty. He has been given omeprazole 20 mg but still needs Tums. Tums use more frequent the last 4 months. Feels like omeprazole not helping. Right after eating, feels good. Sometimes feels like he has to burp a lot. Eating something all day to keep stomach from hurting. Noted as an "empty stomach" type of feeling when describing discomfort. Feels like he is losing weight. Will have pain located LLQ. Last year was more consistent with daily discomfort, but now better. States saw black stool last summer but not recently. No hematochezia. Notes epigastric discomfort associated with needing to burp, bubbling up in acid, one of the biggest problems that happens regularly. Takes Prilosec right before eating breakfast, usually about 12 minutes before. Has been taking for about 6 months now. States that when symptoms first started, wasn't smoking marijuana. Had decreased appetite. Now uses marijuana to stimulate appetite. No dysphagia. Had taken Ibuprofen a lot last year but limits it this year. Eats a lot of fatty, fried food. Ate 4 cheeseburgers back to back the other day and was still hungry.   Outside labs last in  May 2018. Normal LFTs, normal CBC, H.pylori antibody positive but negative H.pylori stool antigen. Acute hepatitis panel negative. HIV non-reactive. US abdomen in May 2018 with normal gallbladder, normal liver. All unremarkable. CT June 2017 normal.    Past Medical History:  Diagnosis Date  . Asthma     Past Surgical History:  Procedure Laterality Date  . None      Current Outpatient Medications  Medication Sig Dispense Refill  . acetaminophen (TYLENOL) 500 MG tablet Take 500-1,000 mg by mouth every 6 (six) hours as needed for mild pain.    Marland Kitchen. albuterol (PROVENTIL HFA;VENTOLIN HFA) 108 (90 BASE) MCG/ACT inhaler Inhale 2 puffs into the lungs every 6 (six) hours as needed for wheezing.    . calcium carbonate (TUMS - DOSED IN MG ELEMENTAL CALCIUM) 500 MG chewable tablet Chew 1 tablet by mouth as needed for indigestion or heartburn.    Marland Kitchen. omeprazole (PRILOSEC) 20 MG capsule Take 20 mg by mouth 2 (two) times daily.     No current facility-administered medications for this visit.     Allergies as of 03/12/2017 - Review Complete 03/12/2017  Allergen Reaction Noted  . Levaquin [levofloxacin] Rash 08/08/2015    Family History  Problem Relation Age of Onset  . Throat cancer Father   . Colon cancer Maternal Aunt        passed away at age 31   . Colon polyps Neg Hx     Social History   Socioeconomic History  . Marital status: Single  Spouse name: Not on file  . Number of children: Not on file  . Years of education: Not on file  . Highest education level: Not on file  Social Needs  . Financial resource strain: Not on file  . Food insecurity - worry: Not on file  . Food insecurity - inability: Not on file  . Transportation needs - medical: Not on file  . Transportation needs - non-medical: Not on file  Occupational History  . Not on file  Tobacco Use  . Smoking status: Never Smoker  . Smokeless tobacco: Never Used  Substance and Sexual Activity  . Alcohol use: Yes     Comment: Occ  . Drug use: Yes    Types: Marijuana    Comment: helps with appetite   . Sexual activity: Yes  Other Topics Concern  . Not on file  Social History Narrative  . Not on file    Review of Systems: Gen: Denies any fever, chills, fatigue, weight loss, lack of appetite.  CV: Denies chest pain, heart palpitations, peripheral edema, syncope.  Resp: Denies shortness of breath at rest or with exertion. Denies wheezing or cough.  GI: see HPI  GU : Denies urinary burning, urinary frequency, urinary hesitancy MS: Denies joint pain, muscle weakness, cramps, or limitation of movement.  Derm: Denies rash, itching, dry skin Psych: Denies depression, anxiety, memory loss, and confusion Heme: Denies bruising, bleeding, and enlarged lymph nodes.  Physical Exam: BP 104/60   Pulse (!) 51   Temp (!) 97 F (36.1 C) (Oral)   Ht 5\' 9"  (1.753 m)   Wt 134 lb 3.2 oz (60.9 kg)   BMI 19.82 kg/m  General:   Alert and oriented. Pleasant and cooperative. Well-nourished and well-developed.  Head:  Normocephalic and atraumatic. Eyes:  Without icterus, sclera clear and conjunctiva pink.  Ears:  Normal auditory acuity. Nose:  No deformity, discharge,  or lesions. Mouth:  No deformity or lesions, oral mucosa pink.  Lungs:  Clear to auscultation bilaterally. No wheezes, rales, or rhonchi. No distress.  Heart:  S1, S2 present without murmurs appreciated.  Abdomen:  +BS, soft, non-tender and non-distended. No HSM noted. No guarding or rebound. No masses appreciated.  Rectal:  Deferred  Msk:  Symmetrical without gross deformities. Normal posture. Extremities:  Without edema. Neurologic:  Alert and  oriented x4 Psych:  Alert and cooperative. Normal mood and affect.

## 2017-03-13 ENCOUNTER — Telehealth: Payer: Self-pay

## 2017-03-13 NOTE — Telephone Encounter (Addendum)
[facility]. ( Case number [id number]).  Effective 03/12/2017-03/13/2018. Pt is aware.

## 2017-03-17 NOTE — Assessment & Plan Note (Signed)
32 year old male with multiple concerns to include vague epigastric discomfort noted as an "emptiness", improved with eating. Associated belching, reflux, no dysphagia. Has been taking Prilosec for at least 6 months but not appropriately for best efficacy. Upon dietary recall, multiple reflux triggers and no dietary/behavior modification thus far. US abdomen on file with normal gallbladder. Discussed with patient strict GERD diet, starting Protonix once daily, taking at least 30 minutes before breakfast, and calling if no significant improvement with symptoms. If he still notes dyspepsia, could pursue EGD with Propofol. He is to call in 2 weeks with an update. Otherwise, we will see him in 2-3 months.

## 2017-03-17 NOTE — Assessment & Plan Note (Signed)
Symptomatic. GERD diet provided. Stop Prilosec and trial Protonix, taking at least 30 minutes before breakfast.

## 2017-03-18 NOTE — Progress Notes (Signed)
CC'D TO PCP °

## 2017-05-21 ENCOUNTER — Ambulatory Visit: Payer: BLUE CROSS/BLUE SHIELD | Admitting: Gastroenterology

## 2018-01-05 ENCOUNTER — Other Ambulatory Visit: Payer: Self-pay

## 2018-01-05 ENCOUNTER — Emergency Department (HOSPITAL_COMMUNITY)
Admission: EM | Admit: 2018-01-05 | Discharge: 2018-01-06 | Disposition: A | Discharge status: Home / Self Care | Payer: BLUE CROSS/BLUE SHIELD | Attending: Emergency Medicine | Admitting: Emergency Medicine

## 2018-01-05 ENCOUNTER — Encounter (HOSPITAL_COMMUNITY): Payer: Self-pay | Admitting: *Deleted

## 2018-01-05 DIAGNOSIS — Z202 Contact with and (suspected) exposure to infections with a predominantly sexual mode of transmission: Secondary | ICD-10-CM | POA: Insufficient documentation

## 2018-01-05 DIAGNOSIS — J45909 Unspecified asthma, uncomplicated: Secondary | ICD-10-CM | POA: Insufficient documentation

## 2018-01-05 DIAGNOSIS — Z79899 Other long term (current) drug therapy: Secondary | ICD-10-CM | POA: Insufficient documentation

## 2018-01-05 DIAGNOSIS — R369 Urethral discharge, unspecified: Secondary | ICD-10-CM

## 2018-01-05 NOTE — ED Triage Notes (Signed)
Pt states he noticed a discharge from his penis yesterday; he has heard that his partner has had other sexual partners; pt states also tonight while at work he was feeling dizzy; pt also states he has some urinary urgency

## 2018-01-06 LAB — URINALYSIS, ROUTINE W REFLEX MICROSCOPIC
Bacteria, UA: NONE SEEN
Bilirubin Urine: NEGATIVE
Glucose, UA: NEGATIVE mg/dL
Hgb urine dipstick: NEGATIVE
Ketones, ur: NEGATIVE mg/dL
Nitrite: NEGATIVE
PROTEIN: NEGATIVE mg/dL
SPECIFIC GRAVITY, URINE: 1.024 (ref 1.005–1.030)
pH: 5 (ref 5.0–8.0)

## 2018-01-06 MED ORDER — STERILE WATER FOR INJECTION IJ SOLN
INTRAMUSCULAR | Status: AC
  Administered 2018-01-06: 10 mL
  Filled 2018-01-06: qty 10

## 2018-01-06 MED ORDER — CEFTRIAXONE SODIUM 250 MG IJ SOLR
250.0000 mg | Freq: Once | INTRAMUSCULAR | Status: AC
  Administered 2018-01-06: 250 mg via INTRAMUSCULAR
  Filled 2018-01-06: qty 250

## 2018-01-06 MED ORDER — AZITHROMYCIN 250 MG PO TABS
1000.0000 mg | ORAL_TABLET | Freq: Once | ORAL | Status: AC
  Administered 2018-01-06: 1000 mg via ORAL
  Filled 2018-01-06: qty 4

## 2018-01-06 NOTE — Discharge Instructions (Signed)
Do not have sex for 2 weeks °Have all partners tested and treated °If your test is abnormal, you will be called but you have been treated for Gonorrhea and Chlamydia today. You can also review your results on MyChart °Practice safe sex and use a condom to prevent infection or unwanted pregnancy °Follow up with the Health Department ° °

## 2018-01-06 NOTE — ED Provider Notes (Signed)
[facility] EMERGENCY DEPARTMENT Provider Note   CSN: [id number] Arrival date & time: 01/05/18  2303     History   Chief Complaint Chief Complaint  Patient presents with  . SEXUALLY TRANSMITTED DISEASE    HPI Richard Clarke is a 32 y.o. male who presents with penile discharge. PMH significant for hx of STD. He started having symptoms today. He's had a clear discharge from his penis with mild testicular pain. He denies dysuria. No fever or vomiting. He reports being with the same partner for 6 years so he doesn't wear condoms. He recently found out his partner was talking to someone else but doesn't know if he was cheated on. He states he thinks he has an STD but doesn't want to believe it.  HPI  Past Medical History:  Diagnosis Date  . Asthma     Patient Active Problem List   Diagnosis Date Noted  . Abdominal pain, chronic, epigastric 03/12/2017  . GERD (gastroesophageal reflux disease) 03/12/2017    Past Surgical History:  Procedure Laterality Date  . None          Home Medications    Prior to Admission medications   Medication Sig Start Date End Date Taking? Authorizing Provider  acetaminophen (TYLENOL) 500 MG tablet Take 500-1,000 mg by mouth every 6 (six) hours as needed for mild pain.    [provider]  albuterol (PROVENTIL HFA;VENTOLIN HFA) 108 (90 BASE) MCG/ACT inhaler Inhale 2 puffs into the lungs every 6 (six) hours as needed for wheezing.    [provider]  calcium carbonate (TUMS - DOSED IN MG ELEMENTAL CALCIUM) 500 MG chewable tablet Chew 1 tablet by mouth as needed for indigestion or heartburn.    [provider]  omeprazole (PRILOSEC) 20 MG capsule Take 20 mg by mouth 2 (two) times daily.    [provider]  pantoprazole (PROTONIX) 40 MG tablet Take 1 tablet (40 mg total) by mouth daily. Take 30 minutes before breakfast 03/12/17   Gelene Mink, NP    Family History Family History  Problem Relation Age of Onset   . Throat cancer Father   . Colon cancer Maternal Aunt        passed away at age 53   . Colon polyps Neg Hx     Social History Social History   Tobacco Use  . Smoking status: Never Smoker  . Smokeless tobacco: Never Used  Substance Use Topics  . Alcohol use: Yes    Comment: Occ  . Drug use: Yes    Types: Marijuana    Comment: helps with appetite      Allergies   Levaquin [levofloxacin]   Review of Systems Review of Systems  Gastrointestinal: Negative for vomiting.  Genitourinary: Positive for discharge and testicular pain. Negative for flank pain, penile pain, penile swelling and scrotal swelling.     Physical Exam Updated Vital Signs BP 129/78 (BP Location: Left Arm)   Pulse (!) 59   Temp (!) 97.4 F (36.3 C) (Oral)   Resp 15   Ht 5\' 9"  (1.753 m)   Wt 59 kg   SpO2 97%   BMI 19.20 kg/m   Physical Exam  Constitutional: He is oriented to person, place, and time. He appears well-developed and well-nourished. No distress.  HENT:  Head: Normocephalic and atraumatic.  Eyes: Pupils are equal, round, and reactive to light. Conjunctivae are normal. Right eye exhibits no discharge. Left eye exhibits no discharge. No scleral icterus.  Neck:  Normal range of motion.  Cardiovascular: Normal rate.  Pulmonary/Chest: Effort normal. No respiratory distress.  Abdominal: He exhibits no distension.  Genitourinary:  Genitourinary Comments: No inguinal lymphadenopathy or inguinal hernia noted. Normal circumcised penis free of lesions or rash. Clear discharge noted. Testicles are nontender with normal lie. Normal scrotal appearance. Chaperone present during exam.    Neurological: He is alert and oriented to person, place, and time.  Skin: Skin is warm and dry.  Psychiatric: He has a normal mood and affect. His behavior is normal.  Nursing note and vitals reviewed.    ED Treatments / Results  Labs (all labs ordered are listed, but only abnormal results are displayed) Labs  Reviewed  URINALYSIS, ROUTINE W REFLEX MICROSCOPIC - Abnormal; Notable for the following components:      Result Value   Leukocytes, UA SMALL (*)    All other components within normal limits  RPR  HIV ANTIBODY (ROUTINE TESTING W REFLEX)  GC/CHLAMYDIA PROBE AMP ([place]) NOT AT [facility]    EKG None  Radiology No results found.  Procedures Procedures (including critical care time)  Medications Ordered in ED Medications  cefTRIAXone (ROCEPHIN) injection 250 mg (has no administration in time range)  azithromycin (ZITHROMAX) tablet 1,000 mg (has no administration in time range)     Initial Impression / Assessment and Plan / ED Course  I have reviewed the triage vital signs and the nursing notes.  Pertinent labs & imaging results that were available during my care of the patient were reviewed by me and considered in my medical decision making (see chart for details).  32 year old male with penile discharge. Hx of STD. Clear discharge is noted on exam. No testicular tenderness. Will treat empirically for gonorrhea and chlamydia. Safe sex practices discussed. Advised f/u with results on MyChart. Return if worsening  Final Clinical Impressions(s) / ED Diagnoses   Final diagnoses:  Penile discharge    ED Discharge Orders    None       Bethel Born, PA-C 01/06/18 Kirstie Peri, MD 01/06/18 [phone]

## 2018-01-07 LAB — GC/CHLAMYDIA PROBE AMP (~~LOC~~) NOT AT ARMC
Chlamydia: NEGATIVE
Neisseria Gonorrhea: NEGATIVE

## 2018-01-07 LAB — RPR: RPR Ser Ql: NONREACTIVE

## 2018-01-07 LAB — HIV ANTIBODY (ROUTINE TESTING W REFLEX): HIV Screen 4th Generation wRfx: NONREACTIVE

## 2018-04-11 ENCOUNTER — Encounter (HOSPITAL_COMMUNITY): Payer: Self-pay

## 2018-04-11 ENCOUNTER — Emergency Department (HOSPITAL_COMMUNITY)
Admission: EM | Admit: 2018-04-11 | Discharge: 2018-04-11 | Disposition: A | Discharge status: Home / Self Care | Payer: Self-pay | Attending: Emergency Medicine | Admitting: Emergency Medicine

## 2018-04-11 ENCOUNTER — Other Ambulatory Visit: Payer: Self-pay

## 2018-04-11 DIAGNOSIS — J45909 Unspecified asthma, uncomplicated: Secondary | ICD-10-CM | POA: Insufficient documentation

## 2018-04-11 DIAGNOSIS — Z79899 Other long term (current) drug therapy: Secondary | ICD-10-CM | POA: Insufficient documentation

## 2018-04-11 DIAGNOSIS — R202 Paresthesia of skin: Secondary | ICD-10-CM | POA: Insufficient documentation

## 2018-04-11 DIAGNOSIS — R200 Anesthesia of skin: Secondary | ICD-10-CM

## 2018-04-11 MED ORDER — ACYCLOVIR 800 MG PO TABS: 800.0000 mg | ORAL_TABLET | Freq: Two times a day (BID) | ORAL | 0 refills | Status: DC

## 2018-04-11 NOTE — Discharge Instructions (Signed)
If symptoms improve with taking acyclovir, talk with your primary care provider about taking longer term suppressive therapy.

## 2018-04-11 NOTE — ED Triage Notes (Signed)
Pt states he has herpes on his lower lip, states he has had some tingling in the area, states he feels like his "whole immune system is affected"   Pt also states his eyes are burning. Pt symptoms x 1 week

## 2018-04-11 NOTE — ED Provider Notes (Signed)
[facility] EMERGENCY DEPARTMENT Provider Note   CSN: [id number] Arrival date & time: 04/11/18  0024     History   Chief Complaint Chief Complaint  Patient presents with  . Cough    HPI Richard Clarke is a 33 y.o. male.  The history is provided by the patient.  Cough  He has history of asthma and comes in stating that he has symptoms of herpes simplex infection. He has noted some cold sores on his lips and feels that his lips are swollen and numb.  This sometimes extends throughout his face.  This is been present for well over a month.  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  He denies fever or chills.  He is concerned about problems with his acne breaking out and he has been washing his face for 5 times a day to try to keep the acne under control.  He states he has been tested for HIV within the past year  Past Medical History:  Diagnosis Date  . Asthma     Patient Active Problem List   Diagnosis Date Noted  . Abdominal pain, chronic, epigastric 03/12/2017  . GERD (gastroesophageal reflux disease) 03/12/2017    Past Surgical History:  Procedure Laterality Date  . None          Home Medications    Prior to Admission medications   Medication Sig Start Date End Date Taking? Authorizing Provider  acetaminophen (TYLENOL) 500 MG tablet Take 500-1,000 mg by mouth every 6 (six) hours as needed for mild pain.    [provider]  albuterol (PROVENTIL HFA;VENTOLIN HFA) 108 (90 BASE) MCG/ACT inhaler Inhale 2 puffs into the lungs every 6 (six) hours as needed for wheezing.    [provider]  calcium carbonate (TUMS - DOSED IN MG ELEMENTAL CALCIUM) 500 MG chewable tablet Chew 1 tablet by mouth as needed for indigestion or heartburn.    [provider]  omeprazole (PRILOSEC) 20 MG capsule Take 20 mg by mouth 2 (two) times daily.    [provider]  pantoprazole (PROTONIX) 40 MG tablet  Take 1 tablet (40 mg total) by mouth daily. Take 30 minutes before breakfast 03/12/17   Gelene MinkBoone, Anna W, NP    Family History Family History  Problem Relation Age of Onset  . Throat cancer Father   . Colon cancer Maternal Aunt        passed away at age 640   . Colon polyps Neg Hx     Social History Social History   Tobacco Use  . Smoking status: Never Smoker  . Smokeless tobacco: Never Used  Substance Use Topics  . Alcohol use: Yes    Comment: Occ  . Drug use: Yes    Types: Marijuana    Comment: helps with appetite      Allergies   Levaquin [levofloxacin]   Review of Systems Review of Systems  Respiratory: Positive for cough.   All other systems reviewed and are negative.    Physical Exam Updated Vital Signs BP 138/89 (BP Location: Right Arm)   Pulse 71   Temp 98.1 F (36.7 C) (Oral)   Resp 15   Ht 5\' 9"  (1.753 m)   Wt 59 kg   SpO2 100%   BMI 19.20 kg/m   Physical Exam Vitals signs and nursing note reviewed.    33 year old male, resting comfortably and in no acute distress. Vital signs  are normal. Oxygen saturation is 100%, which is normal. Head is normocephalic and atraumatic. PERRLA, EOMI. Oropharynx is clear.  Perhaps minimal swelling of the lower lip.  No definite lesions of herpes simplex. Neck is nontender and supple without adenopathy or JVD. Back is nontender and there is no CVA tenderness. Lungs are clear without rales, wheezes, or rhonchi. Chest is nontender. Heart has regular rate and rhythm without murmur. Abdomen is soft, flat, nontender without masses or hepatosplenomegaly and peristalsis is normoactive. Extremities have no cyanosis or edema, full range of motion is present. Skin is warm and dry without rash. Neurologic: Mental status is normal, cranial nerves are intact, there are no motor or sensory deficits.  ED Treatments / Results   Procedures Procedures  Medications Ordered in ED Medications - No data to display   Initial  Impression / Assessment and Plan / ED Course  I have reviewed the triage vital signs and the nursing notes.  Numbness and swelling of lips which could be consistent with herpes simplex, but no lesions visible at this time to suggest that.  Old records were reviewed, and he was seen in August 2016 with similar complaints and no definite diagnosis.  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  We will give trial of a short course of acyclovir and is referred back to his primary care provider.  He may need referral to dermatology for further evaluation.  Final Clinical Impressions(s) / ED Diagnoses   Final diagnoses:  Facial numbness    ED Discharge Orders         Ordered    acyclovir (ZOVIRAX) 800 MG tablet  2 times daily     04/11/18 0538           Dione Booze, MD 04/11/18 [phone]

## 2018-05-06 ENCOUNTER — Emergency Department (HOSPITAL_COMMUNITY)
Admission: EM | Admit: 2018-05-06 | Discharge: 2018-05-06 | Disposition: A | Discharge status: Home / Self Care | Payer: Self-pay | Attending: Emergency Medicine | Admitting: Emergency Medicine

## 2018-05-06 ENCOUNTER — Encounter (HOSPITAL_COMMUNITY): Payer: Self-pay | Admitting: Emergency Medicine

## 2018-05-06 ENCOUNTER — Other Ambulatory Visit: Payer: Self-pay

## 2018-05-06 DIAGNOSIS — J45909 Unspecified asthma, uncomplicated: Secondary | ICD-10-CM | POA: Insufficient documentation

## 2018-05-06 DIAGNOSIS — A084 Viral intestinal infection, unspecified: Secondary | ICD-10-CM | POA: Insufficient documentation

## 2018-05-06 LAB — LIPASE, BLOOD: LIPASE: 25 U/L (ref 11–51)

## 2018-05-06 LAB — COMPREHENSIVE METABOLIC PANEL
ALBUMIN: 4.7 g/dL (ref 3.5–5.0)
ALT: 20 U/L (ref 0–44)
ANION GAP: 10 (ref 5–15)
AST: 21 U/L (ref 15–41)
Alkaline Phosphatase: 46 U/L (ref 38–126)
BUN: 18 mg/dL (ref 6–20)
CHLORIDE: 104 mmol/L (ref 98–111)
CO2: 24 mmol/L (ref 22–32)
Calcium: 9.4 mg/dL (ref 8.9–10.3)
Creatinine, Ser: 0.8 mg/dL (ref 0.61–1.24)
Glucose, Bld: 103 mg/dL — ABNORMAL HIGH (ref 70–99)
Potassium: 3.4 mmol/L — ABNORMAL LOW (ref 3.5–5.1)
SODIUM: 138 mmol/L (ref 135–145)
Total Bilirubin: 1.2 mg/dL (ref 0.3–1.2)
Total Protein: 8 g/dL (ref 6.5–8.1)

## 2018-05-06 LAB — CBC
HCT: 47.6 % (ref 39.0–52.0)
Hemoglobin: 15.4 g/dL (ref 13.0–17.0)
MCH: 26.7 pg (ref 26.0–34.0)
MCHC: 32.4 g/dL (ref 30.0–36.0)
MCV: 82.6 fL (ref 80.0–100.0)
NRBC: 0 % (ref 0.0–0.2)
PLATELETS: 305 10*3/uL (ref 150–400)
RBC: 5.76 MIL/uL (ref 4.22–5.81)
RDW: 13.7 % (ref 11.5–15.5)
WBC: 11.2 10*3/uL — AB (ref 4.0–10.5)

## 2018-05-06 MED ORDER — KETOROLAC TROMETHAMINE 15 MG/ML IJ SOLN
15.0000 mg | Freq: Once | INTRAMUSCULAR | Status: AC
  Administered 2018-05-06: 15 mg via INTRAVENOUS
  Filled 2018-05-06: qty 1

## 2018-05-06 MED ORDER — ONDANSETRON HCL 4 MG/2ML IJ SOLN
4.0000 mg | Freq: Once | INTRAMUSCULAR | Status: AC
  Administered 2018-05-06: 4 mg via INTRAVENOUS
  Filled 2018-05-06: qty 2

## 2018-05-06 MED ORDER — SODIUM CHLORIDE 0.9 % IV BOLUS
1000.0000 mL | Freq: Once | INTRAVENOUS | Status: AC
  Administered 2018-05-06: 1000 mL via INTRAVENOUS

## 2018-05-06 MED ORDER — ONDANSETRON 4 MG PO TBDP: 4.0000 mg | ORAL_TABLET | Freq: Three times a day (TID) | ORAL | 0 refills | Status: DC | PRN

## 2018-05-06 MED ORDER — NAPROXEN 500 MG PO TABS: 500.0000 mg | ORAL_TABLET | Freq: Two times a day (BID) | ORAL | 0 refills | Status: DC

## 2018-05-06 NOTE — ED Provider Notes (Signed)
[facility] Emergency Department Provider Note MRN:  [id number]  Arrival date & time: 05/06/18     Chief Complaint   Nausea   History of Present Illness   Richard Clarke is a 33 y.o. year-old male with no pertinent past medical history presenting to the ED with chief complaint of nausea.  5 to 6 hours ago patient began feeling nauseated, then began experiencing total body aches, malaise, followed by multiple episodes of nonbloody nonbilious emesis, watery diarrhea.  Patient explains that his daughter had to stay home today from daycare due to vomiting.  Denies fever, no chest pain or shortness of breath, intermittent crampy abdominal pain, no dysuria.  Symptoms are constant, no exacerbating relieving factors.  Beginning to feel lightheaded and dehydrated, which brought him to the emergency department.  Review of Systems  A complete 10 system review of systems was obtained and all systems are negative except as noted in the HPI and PMH.   Patient's Health History    Past Medical History:  Diagnosis Date  . Asthma     Past Surgical History:  Procedure Laterality Date  . None      Family History  Problem Relation Age of Onset  . Throat cancer Father   . Colon cancer Maternal Aunt        passed away at age 19   . Colon polyps Neg Hx     Social History   Socioeconomic History  . Marital status: Single    Spouse name: Not on file  . Number of children: Not on file  . Years of education: Not on file  . Highest education level: Not on file  Occupational History  . Not on file  Social Needs  . Financial resource strain: Not on file  . Food insecurity:    Worry: Not on file    Inability: Not on file  . Transportation needs:    Medical: Not on file    Non-medical: Not on file  Tobacco Use  . Smoking status: Never Smoker  . Smokeless tobacco: Never Used  Substance and Sexual Activity  . Alcohol use: Yes    Comment: Occ  . Drug use: Not Currently    Types: Marijuana    Comment: helps with appetite   . Sexual activity: Yes  Lifestyle  . Physical activity:    Days per week: Not on file    Minutes per session: Not on file  . Stress: Not on file  Relationships  . Social connections:    Talks on phone: Not on file    Gets together: Not on file    Attends religious service: Not on file    Active member of club or organization: Not on file    Attends meetings of clubs or organizations: Not on file    Relationship status: Not on file  . Intimate partner violence:    Fear of current or ex partner: Not on file    Emotionally abused: Not on file    Physically abused: Not on file    Forced sexual activity: Not on file  Other Topics Concern  . Not on file  Social History Narrative  . Not on file     Physical Exam  Vital Signs and Nursing Notes reviewed Vitals:   05/06/18 1943  BP: 127/80  Pulse: 71  Resp: 16  Temp: 98.2 F (36.8 C)  SpO2: 100%    CONSTITUTIONAL: Well-appearing, NAD NEURO:  Alert and oriented x 3,  no focal deficits EYES:  eyes equal and reactive ENT/NECK:  no LAD, no JVD CARDIO: Regular rate, well-perfused, normal S1 and S2 PULM:  CTAB no wheezing or rhonchi GI/GU:  normal bowel sounds, non-distended, non-tender MSK/SPINE:  No gross deformities, no edema SKIN:  no rash, atraumatic PSYCH:  Appropriate speech and behavior  Diagnostic and Interventional Summary    Labs Reviewed  COMPREHENSIVE METABOLIC PANEL - Abnormal; Notable for the following components:      Result Value   Potassium 3.4 (*)    Glucose, Bld 103 (*)    All other components within normal limits  CBC - Abnormal; Notable for the following components:   WBC 11.2 (*)    All other components within normal limits  LIPASE, BLOOD    No orders to display    Medications  sodium chloride 0.9 % bolus 1,000 mL (1,000 mLs Intravenous New Bag/Given 05/06/18 2046)  ondansetron (ZOFRAN) injection 4 mg (4 mg Intravenous Given 05/06/18 2044)    ketorolac (TORADOL) 15 MG/ML injection 15 mg (15 mg Intravenous Given 05/06/18 2044)  sodium chloride 0.9 % bolus 1,000 mL (1,000 mLs Intravenous New Bag/Given 05/06/18 2046)     Procedures Critical Care  ED Course and Medical Decision Making  I have reviewed the triage vital signs and the nursing notes.  Pertinent labs & imaging results that were available during my care of the patient were reviewed by me and considered in my medical decision making (see below for details).  Suspect viral gastroenteritis in this 33 year old male otherwise healthy, normal vital signs, appears dry, will provide IV fluids, symptom control, reassess.  Abdomen is soft and reassuring.  Labs reassuring, patient feeling better after IV fluids and medications listed above.  Repeat abdominal exam again reassuring.  Appropriate for discharge, continued hydration at home.  After the discussed management above, the patient was determined to be safe for discharge.  The patient was in agreement with this plan and all questions regarding their care were answered.  ED return precautions were discussed and the patient will return to the ED with any significant worsening of condition.  Elmer Sow. Pilar Plate, MD [facility] Emergency Medicine [facility] [email or web address]  Final Clinical Impressions(s) / ED Diagnoses     ICD-10-CM   1. Viral gastroenteritis A08.4     ED Discharge Orders         Ordered    naproxen (NAPROSYN) 500 MG tablet  2 times daily     05/06/18 2156    ondansetron (ZOFRAN ODT) 4 MG disintegrating tablet  Every 8 hours PRN     05/06/18 2156             Sabas Sous, MD 05/06/18 2200

## 2018-05-06 NOTE — Discharge Instructions (Addendum)
You were evaluated in the Emergency Department and after careful evaluation, we did not find any emergent condition requiring admission or further testing in the hospital.  Your symptoms today seem to be due to a virus causing vomiting and diarrhea.  Your labs are reassuring today.  Please use medications provided to help with your symptoms and help you to drink plenty of water at home.  Please return to the Emergency Department if you experience any worsening of your condition.  We encourage you to follow up with a primary care provider.  Thank you for allowing [place] to be a part of your care.

## 2018-05-06 NOTE — ED Triage Notes (Signed)
Pt c/o n/v/d starting today. States was seen at health dept today and after started vomiting. Pt states his whole body hurts.

## 2018-11-05 ENCOUNTER — Emergency Department (HOSPITAL_COMMUNITY)
Admission: EM | Admit: 2018-11-05 | Discharge: 2018-11-06 | Disposition: A | Discharge status: Home / Self Care | Payer: Self-pay | Attending: Emergency Medicine | Admitting: Emergency Medicine

## 2018-11-05 ENCOUNTER — Other Ambulatory Visit: Payer: Self-pay

## 2018-11-05 ENCOUNTER — Encounter (HOSPITAL_COMMUNITY): Payer: Self-pay | Admitting: Emergency Medicine

## 2018-11-05 DIAGNOSIS — L239 Allergic contact dermatitis, unspecified cause: Secondary | ICD-10-CM | POA: Insufficient documentation

## 2018-11-05 DIAGNOSIS — J45909 Unspecified asthma, uncomplicated: Secondary | ICD-10-CM | POA: Insufficient documentation

## 2018-11-05 MED ORDER — DIPHENHYDRAMINE HCL 25 MG PO CAPS: 25.0000 mg | ORAL_CAPSULE | Freq: Four times a day (QID) | ORAL | 0 refills | Status: DC | PRN

## 2018-11-05 MED ORDER — PREDNISONE 50 MG PO TABS
60.0000 mg | ORAL_TABLET | Freq: Once | ORAL | Status: AC
  Administered 2018-11-06: 60 mg via ORAL
  Filled 2018-11-05: qty 1

## 2018-11-05 MED ORDER — PREDNISONE 10 MG PO TABS: 50.0000 mg | ORAL_TABLET | Freq: Every day | ORAL | 0 refills | Status: DC

## 2018-11-05 NOTE — ED Triage Notes (Signed)
Patient complaining of rash to bilateral arms and torso after doing yard work on Monday. Complaining of itching. States he took benadryl approximately 3 hours ago.

## 2018-11-05 NOTE — ED Provider Notes (Signed)
[facility] EMERGENCY DEPARTMENT Provider Note   CSN: [id number] Arrival date & time: 11/05/18  2215     History   Chief Complaint Chief Complaint  Patient presents with  . Rash    HPI Richard Clarke is a 33 y.o. male.     HPI  33 year old male with history of asthma comes in a chief complaint of rash. Patient reports that he had done some yard work last weekend.  On Monday he started noticing a rash in his left distal arm.  Over time the rash has spread proximally and moving to the other extremity.  He also has a lesion to his lower abdominal region.  The lesions are described as itchy and 1 of them drained serous fluid.  He denies any oral involvement, eye pain, fevers, chills, abdominal pain, cough, shortness of breath, wheezing.  No history of similar symptoms in the past.  Patient is taking an antibiotic for the last couple of weeks for acne -but otherwise no new medications.  He has attempted over-the-counter Benadryl and topical ointment without significant relief.  Past Medical History:  Diagnosis Date  . Asthma     Patient Active Problem List   Diagnosis Date Noted  . Abdominal pain, chronic, epigastric 03/12/2017  . GERD (gastroesophageal reflux disease) 03/12/2017    Past Surgical History:  Procedure Laterality Date  . None          Home Medications    Prior to Admission medications   Medication Sig Start Date End Date Taking? Authorizing Provider  acyclovir (ZOVIRAX) 800 MG tablet Take 1 tablet (800 mg total) by mouth 2 (two) times daily. 03/05/73   Delora Fuel, MD  albuterol (PROVENTIL HFA;VENTOLIN HFA) 108 (90 BASE) MCG/ACT inhaler Inhale 2 puffs into the lungs every 6 (six) hours as needed for wheezing.    [provider]  amitriptyline (ELAVIL) 10 MG tablet Take 10 mg by mouth daily. 04/17/18   [provider]  bismuth subsalicylate (PEPTO BISMOL) 262 MG/15ML suspension Take 30 mLs by mouth every 6 (six) hours as needed for  indigestion.    [provider]  diphenhydrAMINE (BENADRYL) 25 mg capsule Take 1 capsule (25 mg total) by mouth every 6 (six) hours as needed for itching. 11/05/18   Varney Biles, MD  naproxen (NAPROSYN) 500 MG tablet Take 1 tablet (500 mg total) by mouth 2 (two) times daily. 05/06/18   Maudie Flakes, MD  omeprazole (PRILOSEC) 20 MG capsule Take 20 mg by mouth daily.     [provider]  ondansetron (ZOFRAN ODT) 4 MG disintegrating tablet Take 1 tablet (4 mg total) by mouth every 8 (eight) hours as needed for nausea or vomiting. 05/06/18   Maudie Flakes, MD  predniSONE (DELTASONE) 10 MG tablet Take 5 tablets (50 mg total) by mouth daily. 11/05/18   Varney Biles, MD    Family History Family History  Problem Relation Age of Onset  . Throat cancer Father   . Colon cancer Maternal Aunt        passed away at age 32   . Colon polyps Neg Hx     Social History Social History   Tobacco Use  . Smoking status: Never Smoker  . Smokeless tobacco: Never Used  Substance Use Topics  . Alcohol use: Yes    Comment: Occ  . Drug use: Not Currently    Types: Marijuana    Comment: helps with appetite      Allergies   Levaquin [  levofloxacin]   Review of Systems Review of Systems  Eyes: Negative for pain.  Respiratory: Negative for cough and shortness of breath.   Skin: Positive for rash.  Allergic/Immunologic: Negative for environmental allergies, food allergies and immunocompromised state.     Physical Exam Updated Vital Signs BP (!) 118/93 (BP Location: Right Arm)   Pulse (!) 54   Temp 97.8 F (36.6 C) (Oral)   Resp 20   Ht 5\' 9"  (1.753 m)   Wt 61.2 kg   SpO2 100%   BMI 19.94 kg/m   Physical Exam Vitals signs and nursing note reviewed.  Constitutional:      Appearance: He is well-developed.  HENT:     Head: Atraumatic.  Neck:     Musculoskeletal: Neck supple.  Cardiovascular:     Rate and Rhythm: Normal rate.  Pulmonary:     Effort: Pulmonary effort  is normal.  Skin:    General: Skin is warm.     Findings: Erythema and rash present.     Comments: Confluence of vesicular lesions over the distal bilateral extremities noted.  Patient has similar confluence of eruption in a few areas proximally.  There is surrounding erythema.  No crusting, no pustules.  Oral exam and ocular exam reveals no erythema, ulcers.  Neurological:     Mental Status: He is alert and oriented to person, place, and time.      ED Treatments / Results  Labs (all labs ordered are listed, but only abnormal results are displayed) Labs Reviewed - No data to display  EKG None  Radiology No results found.  Procedures Procedures (including critical care time)  Medications Ordered in ED Medications  predniSONE (DELTASONE) tablet 60 mg (has no administration in time range)     Initial Impression / Assessment and Plan / ED Course  I have reviewed the triage vital signs and the nursing notes.  Pertinent labs & imaging results that were available during my care of the patient were reviewed by me and considered in my medical decision making (see chart for details).        33 year old male comes in a chief complaint of rash.  He started noticing rash in his left upper extremity distally on Monday that has spread proximally and to the contralateral side.  He has history of chickenpox.  No known history of any allergies.  He was started on antibiotics couple of weeks ago.  He reports that he was working in a yard prior to the symptoms starting.  The symptoms are described as itchy.  Clinically it appears that he is having contact dermatitis.  Plan is to put him on systemic medications and have her return to the ER if his symptoms get worse.  PCP follow-up in 5 days recommended.  Final Clinical Impressions(s) / ED Diagnoses   Final diagnoses:  Allergic contact dermatitis, unspecified trigger    ED Discharge Orders         Ordered    predniSONE (DELTASONE) 10  MG tablet  Daily     11/05/18 2356    diphenhydrAMINE (BENADRYL) 25 mg capsule  Every 6 hours PRN     11/05/18 2356           Derwood KaplanNanavati, Jasim Harari, MD 11/06/18 0002

## 2018-11-05 NOTE — Discharge Instructions (Signed)
We saw you in the ER for what appears to be allergic reaction/ contact dermatitis. Please take the medications prescribed. PLEASE RETURN TO THE ER IMMEDIATELY IN CASE YOU START HAVING WORSENING SWELLING, DIFFICULTY IN BREATHING ETC.  Follow-up with health department in 5 days to ensure you are getting better.

## 2021-04-15 ENCOUNTER — Encounter (HOSPITAL_COMMUNITY): Payer: Self-pay

## 2021-04-15 ENCOUNTER — Other Ambulatory Visit: Payer: Self-pay

## 2021-04-15 ENCOUNTER — Emergency Department (HOSPITAL_COMMUNITY)
Admission: EM | Admit: 2021-04-15 | Discharge: 2021-04-15 | Disposition: A | Discharge status: Home / Self Care | Payer: Self-pay | Attending: Emergency Medicine | Admitting: Emergency Medicine

## 2021-04-15 ENCOUNTER — Emergency Department (HOSPITAL_COMMUNITY): Payer: Self-pay

## 2021-04-15 DIAGNOSIS — R0602 Shortness of breath: Secondary | ICD-10-CM | POA: Insufficient documentation

## 2021-04-15 DIAGNOSIS — R079 Chest pain, unspecified: Secondary | ICD-10-CM

## 2021-04-15 DIAGNOSIS — J45909 Unspecified asthma, uncomplicated: Secondary | ICD-10-CM | POA: Insufficient documentation

## 2021-04-15 DIAGNOSIS — R0789 Other chest pain: Secondary | ICD-10-CM | POA: Insufficient documentation

## 2021-04-15 LAB — CBC WITH DIFFERENTIAL/PLATELET
Abs Immature Granulocytes: 0.04 10*3/uL (ref 0.00–0.07)
Basophils Absolute: 0.1 10*3/uL (ref 0.0–0.1)
Basophils Relative: 1 %
Eosinophils Absolute: 0.2 10*3/uL (ref 0.0–0.5)
Eosinophils Relative: 2 %
HCT: 45.5 % (ref 39.0–52.0)
Hemoglobin: 15 g/dL (ref 13.0–17.0)
Immature Granulocytes: 0 %
Lymphocytes Relative: 34 %
Lymphs Abs: 3.2 10*3/uL (ref 0.7–4.0)
MCH: 26.1 pg (ref 26.0–34.0)
MCHC: 33 g/dL (ref 30.0–36.0)
MCV: 79.3 fL — ABNORMAL LOW (ref 80.0–100.0)
Monocytes Absolute: 0.9 10*3/uL (ref 0.1–1.0)
Monocytes Relative: 9 %
Neutro Abs: 5.3 10*3/uL (ref 1.7–7.7)
Neutrophils Relative %: 54 %
Platelets: 336 10*3/uL (ref 150–400)
RBC: 5.74 MIL/uL (ref 4.22–5.81)
RDW: 13.2 % (ref 11.5–15.5)
WBC: 9.6 10*3/uL (ref 4.0–10.5)
nRBC: 0 % (ref 0.0–0.2)

## 2021-04-15 LAB — BASIC METABOLIC PANEL
Anion gap: 12 (ref 5–15)
BUN: 19 mg/dL (ref 6–20)
CO2: 22 mmol/L (ref 22–32)
Calcium: 10.2 mg/dL (ref 8.9–10.3)
Chloride: 104 mmol/L (ref 98–111)
Creatinine, Ser: 0.75 mg/dL (ref 0.61–1.24)
GFR, Estimated: >60 mL/min (ref >60)
Glucose, Bld: 95 mg/dL (ref 70–99)
Potassium: 3.6 mmol/L (ref 3.5–5.1)
Sodium: 138 mmol/L (ref 135–145)

## 2021-04-15 LAB — D-DIMER, QUANTITATIVE: D-Dimer, Quant: 0.42 ug/mL-FEU (ref 0.00–0.50)

## 2021-04-15 LAB — TROPONIN I (HIGH SENSITIVITY): Troponin I (High Sensitivity): 4 ng/L (ref <18)

## 2021-04-15 MED ORDER — IBUPROFEN 800 MG PO TABS: 800.0000 mg | ORAL_TABLET | Freq: Four times a day (QID) | ORAL | 0 refills | Status: AC | PRN

## 2021-04-15 MED ORDER — KETOROLAC TROMETHAMINE 30 MG/ML IJ SOLN
30.0000 mg | Freq: Once | INTRAMUSCULAR | Status: AC
  Administered 2021-04-15: 30 mg via INTRAVENOUS
  Filled 2021-04-15: qty 1

## 2021-04-15 MED ORDER — COLCHICINE 0.6 MG PO TABS: 0.6000 mg | ORAL_TABLET | Freq: Every day | ORAL | 0 refills | Status: DC

## 2021-04-15 MED ORDER — HYDROCODONE-ACETAMINOPHEN 5-325 MG PO TABS
1.0000 | ORAL_TABLET | Freq: Once | ORAL | Status: AC
  Administered 2021-04-15: 1 via ORAL
  Filled 2021-04-15: qty 1

## 2021-04-15 NOTE — ED Triage Notes (Signed)
Pt c/o "right lung pain". States it hurts to breathe. Dr. Betsey Holiday at bedside

## 2021-04-15 NOTE — ED Provider Notes (Signed)
[facility] EMERGENCY DEPARTMENT Provider Note   CSN: [id number] Arrival date & time: 04/15/21  [id number]     History  Chief Complaint  Patient presents with   Shortness of Breath    Richard Clarke is a 36 y.o. male.  Patient presents to the emergency department for evaluation of right-sided chest pain and shortness of breath.  Patient reports that he is having a sharp pain in the right rib area that makes it hard for him to breathe.  He does have history of asthma but does not feel like he is wheezing currently.  Pain worsens if he takes a deep breath, coughs or lays flat.      Home Medications Prior to Admission medications   Medication Sig Start Date End Date Taking? Authorizing Provider  colchicine 0.6 MG tablet Take 1 tablet (0.6 mg total) by mouth daily. 04/15/21  Yes Robinn Overholt, Gwenyth Allegra, MD  ibuprofen (ADVIL) 800 MG tablet Take 1 tablet (800 mg total) by mouth every 6 (six) hours as needed for moderate pain. 04/15/21  Yes Ahmed Inniss, Gwenyth Allegra, MD  acyclovir (ZOVIRAX) 800 MG tablet Take 1 tablet (800 mg total) by mouth 2 (two) times daily. [id number]   Delora Fuel, MD  albuterol (PROVENTIL HFA;VENTOLIN HFA) 108 (90 BASE) MCG/ACT inhaler Inhale 2 puffs into the lungs every 6 (six) hours as needed for wheezing.    [provider]  amitriptyline (ELAVIL) 10 MG tablet Take 10 mg by mouth daily. 04/17/18   [provider]  bismuth subsalicylate (PEPTO BISMOL) 262 MG/15ML suspension Take 30 mLs by mouth every 6 (six) hours as needed for indigestion.    [provider]  diphenhydrAMINE (BENADRYL) 25 mg capsule Take 1 capsule (25 mg total) by mouth every 6 (six) hours as needed for itching. 11/05/18   Varney Biles, MD  naproxen (NAPROSYN) 500 MG tablet Take 1 tablet (500 mg total) by mouth 2 (two) times daily. 05/06/18   Maudie Flakes, MD  omeprazole (PRILOSEC) 20 MG capsule Take 20 mg by mouth daily.     [provider]  ondansetron (ZOFRAN ODT) 4  MG disintegrating tablet Take 1 tablet (4 mg total) by mouth every 8 (eight) hours as needed for nausea or vomiting. 05/06/18   Maudie Flakes, MD  predniSONE (DELTASONE) 10 MG tablet Take 5 tablets (50 mg total) by mouth daily. 11/05/18   Varney Biles, MD      Allergies    Levaquin [levofloxacin]    Review of Systems   Review of Systems  Respiratory:  Positive for shortness of breath.   Cardiovascular:  Positive for chest pain.   Physical Exam Updated Vital Signs BP 125/83    Pulse 81    Temp 97.6 F (36.4 C)    Resp 12    Ht 5\' 9"  (1.753 m)    Wt 63.5 kg    SpO2 100%    BMI 20.67 kg/m  Physical Exam Vitals and nursing note reviewed.  Constitutional:      General: He is not in acute distress.    Appearance: He is well-developed.  HENT:     Head: Normocephalic and atraumatic.     Mouth/Throat:     Mouth: Mucous membranes are moist.  Eyes:     General: Vision grossly intact. Gaze aligned appropriately.     Extraocular Movements: Extraocular movements intact.     Conjunctiva/sclera: Conjunctivae normal.  Cardiovascular:     Rate and Rhythm: Normal rate and regular rhythm.  Pulses: Normal pulses.     Heart sounds: Normal heart sounds, S1 normal and S2 normal. No murmur heard.   No friction rub. No gallop.  Pulmonary:     Effort: Pulmonary effort is normal. No respiratory distress.     Breath sounds: Normal breath sounds.  Chest:     Chest wall: Tenderness present.  Abdominal:     Palpations: Abdomen is soft.     Tenderness: There is no abdominal tenderness. There is no guarding or rebound.     Hernia: No hernia is present.  Musculoskeletal:        General: No swelling.     Cervical back: Full passive range of motion without pain, normal range of motion and neck supple. No pain with movement, spinous process tenderness or muscular tenderness. Normal range of motion.     Right lower leg: No edema.     Left lower leg: No edema.  Skin:    General: Skin is warm and dry.      Capillary Refill: Capillary refill takes less than 2 seconds.     Findings: No ecchymosis, erythema, lesion or wound.  Neurological:     Mental Status: He is alert and oriented to person, place, and time.     GCS: GCS eye subscore is 4. GCS verbal subscore is 5. GCS motor subscore is 6.     Cranial Nerves: Cranial nerves 2-12 are intact.     Sensory: Sensation is intact.     Motor: Motor function is intact. No weakness or abnormal muscle tone.     Coordination: Coordination is intact.  Psychiatric:        Mood and Affect: Mood normal.        Speech: Speech normal.        Behavior: Behavior normal.    ED Results / Procedures / Treatments   Labs (all labs ordered are listed, but only abnormal results are displayed) Labs Reviewed  CBC WITH DIFFERENTIAL/PLATELET - Abnormal; Notable for the following components:      Result Value   MCV 79.3 (*)    All other components within normal limits  BASIC METABOLIC PANEL  D-DIMER, QUANTITATIVE  TROPONIN I (HIGH SENSITIVITY)    EKG EKG Interpretation  Date/Time:  Sunday April 15 2021 04:02:26 EST Ventricular Rate:  74 PR Interval:  155 QRS Duration: 86 QT Interval:  382 QTC Calculation: 424 R Axis:   78 Text Interpretation: Sinus rhythm Probable left atrial enlargement Benign early repolarization vs Pericarditis Confirmed by Orpah Greek [phone]) on 04/15/2021 4:27:59 AM  Radiology DG Chest Port 1 View  Result Date: 04/15/2021 CLINICAL DATA:  Chest pain, shortness of breath EXAM: PORTABLE CHEST 1 VIEW COMPARISON:  03/09/2016 FINDINGS: Lungs are clear.  No pleural effusion or pneumothorax. The heart is normal in size. IMPRESSION: No evidence of acute cardiopulmonary disease. Electronically Signed   By: Julian Hy M.D.   On: 04/15/2021 03:15    Procedures Procedures    Medications Ordered in ED Medications  ketorolac (TORADOL) 30 MG/ML injection 30 mg (has no administration in time range)   HYDROcodone-acetaminophen (NORCO/VICODIN) 5-325 MG per tablet 1 tablet (has no administration in time range)    ED Course/ Medical Decision Making/ A&P                           Medical Decision Making Amount and/or Complexity of Data Reviewed Labs: ordered. Radiology: ordered.   Patient presents to the  emergency [facility] for evaluation of right-sided chest pain.  Patient reports shortness of breath.  Differential diagnosis would be pneumonia, PE, pneumothorax, pericarditis, chest wall pain.  Patient reports a history of costochondritis but that generally causes pain over the sternum.  This is a new pain.  He has not had any injury.  Chest x-ray is normal.  Troponin negative.  EKG without ischemic changes.  There are diffuse ST elevations which are likely benign early repolarization, but I cannot rule out PR depressions and therefore pericarditis is a possibility.  We will treat empirically as pericarditis which will also help treat for musculoskeletal pain, follow-up with cardiology as an outpatient.        Final Clinical Impression(s) / ED Diagnoses Final diagnoses:  Chest pain, unspecified type    Rx / DC Orders ED Discharge Orders          Ordered    Ambulatory referral to Cardiology        04/15/21 0428    colchicine 0.6 MG tablet  Daily        04/15/21 0429    ibuprofen (ADVIL) 800 MG tablet  Every 6 hours PRN        04/15/21 0429              Orpah Greek, MD 04/15/21 0430

## 2021-08-15 ENCOUNTER — Encounter: Payer: Self-pay | Admitting: Internal Medicine

## 2021-09-12 ENCOUNTER — Telehealth (INDEPENDENT_AMBULATORY_CARE_PROVIDER_SITE_OTHER): Payer: Self-pay

## 2021-09-12 ENCOUNTER — Other Ambulatory Visit (INDEPENDENT_AMBULATORY_CARE_PROVIDER_SITE_OTHER): Payer: Self-pay

## 2021-09-12 ENCOUNTER — Encounter: Payer: Self-pay | Admitting: Internal Medicine

## 2021-09-12 ENCOUNTER — Ambulatory Visit (INDEPENDENT_AMBULATORY_CARE_PROVIDER_SITE_OTHER): Payer: Commercial Managed Care - PPO | Admitting: Internal Medicine

## 2021-09-12 ENCOUNTER — Encounter (INDEPENDENT_AMBULATORY_CARE_PROVIDER_SITE_OTHER): Payer: Self-pay

## 2021-09-12 VITALS — BP 107/62 | HR 47 | Temp 97.5°F | Ht 69.0 in | Wt 139.7 lb

## 2021-09-12 DIAGNOSIS — G8929 Other chronic pain: Secondary | ICD-10-CM

## 2021-09-12 DIAGNOSIS — R1013 Epigastric pain: Secondary | ICD-10-CM

## 2021-09-12 DIAGNOSIS — R1032 Left lower quadrant pain: Secondary | ICD-10-CM

## 2021-09-12 DIAGNOSIS — K219 Gastro-esophageal reflux disease without esophagitis: Secondary | ICD-10-CM

## 2021-09-12 DIAGNOSIS — R140 Abdominal distension (gaseous): Secondary | ICD-10-CM | POA: Diagnosis not present

## 2021-09-12 DIAGNOSIS — K209 Esophagitis, unspecified without bleeding: Secondary | ICD-10-CM

## 2021-09-12 DIAGNOSIS — R194 Change in bowel habit: Secondary | ICD-10-CM

## 2021-09-12 MED ORDER — NA SULFATE-K SULFATE-MG SULF 17.5-3.13-1.6 GM/177ML PO SOLN
1.0000 | Freq: Once | ORAL | 0 refills | Status: AC
Start: 2021-09-12 — End: 2021-09-12

## 2021-09-12 NOTE — Patient Instructions (Signed)
We will schedule you for upper endoscopy and colonoscopy to further evaluate your abdominal pain, chronic reflux, indigestion, nausea.  I am going to check blood work at [facility] today as well to screen you for thyroid disease, celiac disease, as well as check an inflammatory marker called CRP.  Continue on omeprazole 40 mg for now.  We may need to make adjustments pending endoscopic evaluation.  It was very nice meeting you today.  Dr. Marletta Lor

## 2021-09-12 NOTE — Telephone Encounter (Signed)
Richard Clarke, CMA  ?

## 2021-09-12 NOTE — Progress Notes (Signed)
Primary Care Physician:  Tylene Fantasia., PA-C Primary Gastroenterologist:  Dr. Marletta Lor  Chief Complaint  Patient presents with   Abdominal Pain    Abdominal pain on left side, nausea off and on, reflux, bloating, gas. Tried omeprazole, famotidine, prilosec, in the past. No relief. Currently taking omeprazole 40mg  once daily.     HPI:   Richard Clarke is a 36 y.o. male who presents to the clinic today with multiple GI complaints.  Symptoms have been going on for over 4 years.  Last seen in our clinic 2019.  Notes chronic left lower quadrant pain previously this was intermittent in nature, now occurring every day.  States his pain will get up to 7 out of 10 at times.  Does ease off to some degree but never truly goes away.  Bowel movements do not help.  No melena hematochezia.  States he feels as though his bowel movements are regular but does note that he only has a bowel movement once every 2 to 3 days.  Notes family history of colon cancer in aunt at age 69.  Also with chronic epigastric pain, acid reflux, indigestion, chronic nausea.  Previously on pantoprazole which did not help.  Now on omeprazole recently increased to 40 mg daily.  Too soon to see if this has made a difference or not.  No dysphagia odynophagia.  No epigastric or chest pain.  No chronic NSAID use  Past Medical History:  Diagnosis Date   Asthma     Past Surgical History:  Procedure Laterality Date   None      Current Outpatient Medications  Medication Sig Dispense Refill   albuterol (PROVENTIL HFA;VENTOLIN HFA) 108 (90 BASE) MCG/ACT inhaler Inhale 2 puffs into the lungs every 6 (six) hours as needed for wheezing.     bismuth subsalicylate (PEPTO BISMOL) 262 MG/15ML suspension Take 30 mLs by mouth every 6 (six) hours as needed for indigestion.     diphenhydrAMINE (BENADRYL) 25 mg capsule Take 1 capsule (25 mg total) by mouth every 6 (six) hours as needed for itching. 20 capsule 0   famotidine (PEPCID) 20  MG tablet Take 20 mg by mouth. Takes daily as needed     ibuprofen (ADVIL) 800 MG tablet Take 1 tablet (800 mg total) by mouth every 6 (six) hours as needed for moderate pain. 20 tablet 0   omeprazole (PRILOSEC) 40 MG capsule Take 40 mg by mouth daily. One in the morning     predniSONE (DELTASONE) 10 MG tablet Take 5 tablets (50 mg total) by mouth daily. 25 tablet 0   No current facility-administered medications for this visit.    Allergies as of 09/12/2021 - Review Complete 09/12/2021  Allergen Reaction Noted   Levaquin [levofloxacin] Rash 08/08/2015    Family History  Problem Relation Age of Onset   Throat cancer Father    Colon cancer Maternal Aunt        passed away at age 81    Colon polyps Neg Hx     Social History   Socioeconomic History   Marital status: Single    Spouse name: Not on file   Number of children: Not on file   Years of education: Not on file   Highest education level: Not on file  Occupational History   Not on file  Tobacco Use   Smoking status: Never    Passive exposure: Never   Smokeless tobacco: Never  Substance and Sexual Activity   Alcohol use:  Not Currently    Comment: Occ   Drug use: Not Currently    Types: Marijuana    Comment: helps with appetite    Sexual activity: Yes  Other Topics Concern   Not on file  Social History Narrative   Not on file   Social Determinants of Health   Financial Resource Strain: Not on file  Food Insecurity: Not on file  Transportation Needs: Not on file  Physical Activity: Not on file  Stress: Not on file  Social Connections: Not on file  Intimate Partner Violence: Not on file    Subjective: Review of Systems  Constitutional:  Negative for chills and fever.  HENT:  Negative for congestion and hearing loss.   Eyes:  Negative for blurred vision and double vision.  Respiratory:  Negative for cough and shortness of breath.   Cardiovascular:  Negative for chest pain and palpitations.   Gastrointestinal:  Positive for abdominal pain. Negative for blood in stool, constipation, diarrhea, heartburn, melena and vomiting.  Genitourinary:  Negative for dysuria and urgency.  Musculoskeletal:  Negative for joint pain and myalgias.  Skin:  Negative for itching and rash.  Neurological:  Negative for dizziness and headaches.  Psychiatric/Behavioral:  Negative for depression. The patient is not nervous/anxious.        Objective: BP 107/62 (BP Location: Left Arm, Patient Position: Sitting, Cuff Size: Normal)   Pulse (!) 47   Temp (!) 97.5 F (36.4 C) (Oral)   Ht 5\' 9"  (1.753 m)   Wt 139 lb 11.2 oz (63.4 kg)   BMI 20.63 kg/m  Physical Exam Constitutional:      Appearance: Normal appearance.  HENT:     Head: Normocephalic and atraumatic.  Eyes:     Extraocular Movements: Extraocular movements intact.     Conjunctiva/sclera: Conjunctivae normal.  Cardiovascular:     Rate and Rhythm: Normal rate and regular rhythm.  Pulmonary:     Effort: Pulmonary effort is normal.     Breath sounds: Normal breath sounds.  Abdominal:     General: Bowel sounds are normal.     Palpations: Abdomen is soft.  Musculoskeletal:        General: Normal range of motion.     Cervical back: Normal range of motion and neck supple.  Skin:    General: Skin is warm.  Neurological:     General: No focal deficit present.     Mental Status: He is alert and oriented to person, place, and time.  Psychiatric:        Mood and Affect: Mood normal.        Behavior: Behavior normal.      Assessment: *Chronic GERD *Epigastric pain *Chronic nausea  *LLQ abdominal pain  Plan: Will schedule for EGD to evaluate for peptic ulcer disease, esophagitis, gastritis, H. Pylori, duodenitis, or other. Will also evaluate for esophageal stricture, Schatzki's ring, esophageal web or other.   At the same time will perform colonoscopy to evaluate abdominal pain, change in bowel habits.  The risks including  infection, bleed, or perforation as well as benefits, limitations, alternatives and imponderables have been reviewed with the patient. Potential for esophageal dilation, biopsy, etc. have also been reviewed.  Questions have been answered. All parties agreeable.  I will check TSH, celiac titers, CRP.  Continue omeprazole 40 mg daily.  May need to make adjustments pending endoscopic evaluation.  Follow-up after procedures.  09/12/2021 10:00 AM   Disclaimer: This note was dictated with voice recognition software. Similar  sounding words can inadvertently be transcribed and may not be corrected upon review.

## 2021-09-13 LAB — C-REACTIVE PROTEIN: CRP: 0.7 mg/L (ref <8.0)

## 2021-09-13 LAB — TSH: TSH: 1.16 mIU/L (ref 0.40–4.50)

## 2021-09-13 LAB — TISSUE TRANSGLUTAMINASE ABS,IGG,IGA
(tTG) Ab, IgA: <1 U/mL
(tTG) Ab, IgG: <1 U/mL

## 2021-10-01 ENCOUNTER — Telehealth: Payer: Self-pay | Admitting: *Deleted

## 2021-10-01 ENCOUNTER — Telehealth: Payer: Self-pay | Admitting: Internal Medicine

## 2021-10-01 NOTE — Telephone Encounter (Signed)
LMOVM for pt to call back to reschedule procedure

## 2021-10-01 NOTE — Telephone Encounter (Signed)
Pt returning call. [phone]

## 2021-10-02 NOTE — Telephone Encounter (Signed)
Spoke with pt. He has been rescheduled to 8/14 at 1pm. Aware will mail new instructions. Endo aware of change.

## 2021-10-02 NOTE — Telephone Encounter (Signed)
See prior note

## 2021-10-11 ENCOUNTER — Encounter (HOSPITAL_COMMUNITY)
Admission: RE | Admit: 2021-10-11 | Discharge: 2021-10-11 | Disposition: A | Discharge status: Home / Self Care | Payer: Commercial Managed Care - PPO | Source: Ambulatory Visit | Attending: Internal Medicine | Admitting: Internal Medicine

## 2021-10-11 NOTE — Pre-Procedure Instructions (Signed)
Attempted pre-op phone call. [phone] is not in service. Left a VM for hin to call back on [phone].

## 2021-10-12 NOTE — Pre-Procedure Instructions (Signed)
Messaged Mindy @ [facility] because we have been unable to get in touch with patient.

## 2021-10-15 ENCOUNTER — Encounter (HOSPITAL_COMMUNITY)
Admission: RE | Disposition: A | Discharge status: Home / Self Care | Payer: Self-pay | Source: Home / Self Care | Attending: Internal Medicine

## 2021-10-15 ENCOUNTER — Ambulatory Visit (HOSPITAL_COMMUNITY)
Admission: RE | Admit: 2021-10-15 | Discharge: 2021-10-15 | Disposition: A | Discharge status: Home / Self Care | Payer: Commercial Managed Care - PPO | Attending: Internal Medicine | Admitting: Internal Medicine

## 2021-10-15 ENCOUNTER — Ambulatory Visit (HOSPITAL_COMMUNITY): Payer: Commercial Managed Care - PPO | Admitting: Anesthesiology

## 2021-10-15 ENCOUNTER — Ambulatory Visit (HOSPITAL_BASED_OUTPATIENT_CLINIC_OR_DEPARTMENT_OTHER): Payer: Commercial Managed Care - PPO | Admitting: Anesthesiology

## 2021-10-15 ENCOUNTER — Other Ambulatory Visit: Payer: Self-pay

## 2021-10-15 ENCOUNTER — Encounter (HOSPITAL_COMMUNITY): Payer: Self-pay

## 2021-10-15 DIAGNOSIS — R194 Change in bowel habit: Secondary | ICD-10-CM | POA: Diagnosis not present

## 2021-10-15 DIAGNOSIS — K648 Other hemorrhoids: Secondary | ICD-10-CM

## 2021-10-15 DIAGNOSIS — K2970 Gastritis, unspecified, without bleeding: Secondary | ICD-10-CM

## 2021-10-15 DIAGNOSIS — R1032 Left lower quadrant pain: Secondary | ICD-10-CM | POA: Diagnosis present

## 2021-10-15 DIAGNOSIS — K209 Esophagitis, unspecified without bleeding: Secondary | ICD-10-CM

## 2021-10-15 DIAGNOSIS — K219 Gastro-esophageal reflux disease without esophagitis: Secondary | ICD-10-CM

## 2021-10-15 DIAGNOSIS — K2100 Gastro-esophageal reflux disease with esophagitis, without bleeding: Secondary | ICD-10-CM | POA: Diagnosis not present

## 2021-10-15 DIAGNOSIS — K635 Polyp of colon: Secondary | ICD-10-CM | POA: Insufficient documentation

## 2021-10-15 DIAGNOSIS — G8929 Other chronic pain: Secondary | ICD-10-CM

## 2021-10-15 DIAGNOSIS — J45909 Unspecified asthma, uncomplicated: Secondary | ICD-10-CM | POA: Diagnosis not present

## 2021-10-15 HISTORY — PX: COLONOSCOPY WITH PROPOFOL: SHX5780

## 2021-10-15 HISTORY — PX: POLYPECTOMY: SHX5525

## 2021-10-15 HISTORY — PX: BIOPSY: SHX5522

## 2021-10-15 HISTORY — PX: ESOPHAGOGASTRODUODENOSCOPY (EGD) WITH PROPOFOL: SHX5813

## 2021-10-15 SURGERY — COLONOSCOPY WITH PROPOFOL
Anesthesia: General

## 2021-10-15 MED ORDER — LIDOCAINE HCL (CARDIAC) PF 100 MG/5ML IV SOSY
PREFILLED_SYRINGE | INTRAVENOUS | Status: DC | PRN
  Administered 2021-10-15: 50 mg via INTRAVENOUS

## 2021-10-15 MED ORDER — LACTATED RINGERS IV SOLN: INTRAVENOUS | Status: DC

## 2021-10-15 MED ORDER — PROPOFOL 500 MG/50ML IV EMUL
INTRAVENOUS | Status: DC | PRN
  Administered 2021-10-15: 150 ug/kg/min via INTRAVENOUS

## 2021-10-15 MED ORDER — PANTOPRAZOLE SODIUM 40 MG PO TBEC: 40.0000 mg | DELAYED_RELEASE_TABLET | Freq: Two times a day (BID) | ORAL | 11 refills | Status: DC

## 2021-10-15 MED ORDER — PROPOFOL 10 MG/ML IV BOLUS
INTRAVENOUS | Status: DC | PRN
  Administered 2021-10-15: 100 mg via INTRAVENOUS
  Administered 2021-10-15: 40 mg via INTRAVENOUS

## 2021-10-15 NOTE — Anesthesia Postprocedure Evaluation (Signed)
Anesthesia Post Note  Patient: Richard Clarke  Procedure(s) Performed: COLONOSCOPY WITH PROPOFOL ESOPHAGOGASTRODUODENOSCOPY (EGD) WITH PROPOFOL BIOPSY POLYPECTOMY  Patient location during evaluation: Phase II Anesthesia Type: General Level of consciousness: awake and alert and oriented Pain management: pain level controlled Vital Signs Assessment: post-procedure vital signs reviewed and stable Respiratory status: spontaneous breathing, nonlabored ventilation and respiratory function stable Cardiovascular status: blood pressure returned to baseline and stable Postop Assessment: no apparent nausea or vomiting Anesthetic complications: no   No notable events documented.   Last Vitals:  Vitals:   10/15/21 1157 10/15/21 1328  BP: 116/71 (!) 91/42  Pulse: (!) 49 65  Resp: 10 12  Temp: 36.7 C 36.7 C  SpO2: 100% 99%    Last Pain:  Vitals:   10/15/21 1328  TempSrc: Oral  PainSc: Asleep                 Trica Usery C Qu[place]alli Clos

## 2021-10-15 NOTE — Transfer of Care (Signed)
Immediate Anesthesia Transfer of Care Note  Patient: Richard Clarke  Procedure(s) Performed: COLONOSCOPY WITH PROPOFOL ESOPHAGOGASTRODUODENOSCOPY (EGD) WITH PROPOFOL BIOPSY POLYPECTOMY  Patient Location: Short Stay  Anesthesia Type:General  Level of Consciousness: drowsy  Airway & Oxygen Therapy: Patient Spontanous Breathing  Post-op Assessment: Report given to RN and Post -op Vital signs reviewed and stable  Post vital signs: Reviewed and stable  Last Vitals:  Vitals Value Taken Time  BP 91/42 10/15/21 1328  Temp 36.7 C 10/15/21 1328  Pulse 65 10/15/21 1328  Resp 12 10/15/21 1328  SpO2 99 % 10/15/21 1328    Last Pain:  Vitals:   10/15/21 1328  TempSrc: Oral  PainSc: Asleep         Complications: No notable events documented.

## 2021-10-15 NOTE — Discharge Instructions (Signed)
EGD Discharge instructions Please read the instructions outlined below and refer to this sheet in the next few weeks. These discharge instructions provide you with general information on caring for yourself after you leave the hospital. Your doctor may also give you specific instructions. While your treatment has been planned according to the most current medical practices available, unavoidable complications occasionally occur. If you have any problems or questions after discharge, please call your doctor. ACTIVITY You may resume your regular activity but move at a slower pace for the next 24 hours.  Take frequent rest periods for the next 24 hours.  Walking will help expel (get rid of) the air and reduce the bloated feeling in your abdomen.  No driving for 24 hours (because of the anesthesia (medicine) used during the test).  You may shower.  Do not sign any important legal documents or operate any machinery for 24 hours (because of the anesthesia used during the test).  NUTRITION Drink plenty of fluids.  You may resume your normal diet.  Begin with a light meal and progress to your normal diet.  Avoid alcoholic beverages for 24 hours or as instructed by your caregiver.  MEDICATIONS You may resume your normal medications unless your caregiver tells you otherwise.  WHAT YOU CAN EXPECT TODAY You may experience abdominal discomfort such as a feeling of fullness or "gas" pains.  FOLLOW-UP Your doctor will discuss the results of your test with you.  SEEK IMMEDIATE MEDICAL ATTENTION IF ANY OF THE FOLLOWING OCCUR: Excessive nausea (feeling sick to your stomach) and/or vomiting.  Severe abdominal pain and distention (swelling).  Trouble swallowing.  Temperature over 101 F (37.8 C).  Rectal bleeding or vomiting of blood.     Colonoscopy Discharge Instructions  Read the instructions outlined below and refer to this sheet in the next few weeks. These discharge instructions provide you with  general information on caring for yourself after you leave the hospital. Your doctor may also give you specific instructions. While your treatment has been planned according to the most current medical practices available, unavoidable complications occasionally occur.   ACTIVITY You may resume your regular activity, but move at a slower pace for the next 24 hours.  Take frequent rest periods for the next 24 hours.  Walking will help get rid of the air and reduce the bloated feeling in your belly (abdomen).  No driving for 24 hours (because of the medicine (anesthesia) used during the test).   Do not sign any important legal documents or operate any machinery for 24 hours (because of the anesthesia used during the test).  NUTRITION Drink plenty of fluids.  You may resume your normal diet as instructed by your doctor.  Begin with a light meal and progress to your normal diet. Heavy or fried foods are harder to digest and may make you feel sick to your stomach (nauseated).  Avoid alcoholic beverages for 24 hours or as instructed.  MEDICATIONS You may resume your normal medications unless your doctor tells you otherwise.  WHAT YOU CAN EXPECT TODAY Some feelings of bloating in the abdomen.  Passage of more gas than usual.  Spotting of blood in your stool or on the toilet paper.  IF YOU HAD POLYPS REMOVED DURING THE COLONOSCOPY: No aspirin products for 7 days or as instructed.  No alcohol for 7 days or as instructed.  Eat a soft diet for the next 24 hours.  FINDING OUT THE RESULTS OF YOUR TEST Not all test results are  available during your visit. If your test results are not back during the visit, make an appointment with your caregiver to find out the results. Do not assume everything is normal if you have not heard from your caregiver or the medical facility. It is important for you to follow up on all of your test results.  SEEK IMMEDIATE MEDICAL ATTENTION IF: You have more than a spotting of  blood in your stool.  Your belly is swollen (abdominal distention).  You are nauseated or vomiting.  You have a temperature over 101.  You have abdominal pain or discomfort that is severe or gets worse throughout the day.   Your EGD revealed mild amount inflammation in your stomach.  I took biopsies of this to rule out infection with a bacteria called H. pylori.  Await pathology results, my office will contact you.  I am going to change you omeprazole to pantoprazole and increase to 40 mg twice daily.  Avoid NSAIDs.  Your colon overall looked very healthy.  I did not see any active inflammation indicative of underlying inflammatory bowel disease such as Crohn's disease or ulcerative colitis.  You did have 1 polyp which I removed.  Await pathology results, office will contact you.  If precancerous we will need to repeat colonoscopy in 5 years otherwise you are good until age 89.  Follow-up with GI in 3 to 4 months.  I hope you have a great rest of your week!  Hennie Duos. Marletta Lor, D.O. Gastroenterology and Hepatology [facility]

## 2021-10-15 NOTE — Op Note (Signed)
[facility] Patient Name: Richard Clarke Procedure Date: 10/15/2021 1:06 PM MRN: [id number] Date of Birth: [birth year] Attending MD: Elon Alas. Abbey Chatters DO CSN: [id number] Age: 36 Admit Type: Outpatient Procedure:                Colonoscopy Indications:              Abdominal pain in the left lower quadrant, Change                            in bowel habits Providers:                Elon Alas. Abbey Chatters, DO, Janeece Riggers, RN, Charlsie Quest                            Theda Sers RN, RN, Everardo Pacific, Aram Candela Referring MD:              Medicines:                See the Anesthesia note for documentation of the                            administered medications Complications:            No immediate complications. Estimated Blood Loss:     Estimated blood loss was minimal. Procedure:                Pre-Anesthesia Assessment:                           - The anesthesia plan was to use monitored                            anesthesia care (MAC).                           After obtaining informed consent, the colonoscope                            was passed under direct vision. Throughout the                            procedure, the patient's blood pressure, pulse, and                            oxygen saturations were monitored continuously. The                            PCF-HQ190L ([id number]) scope was introduced through                            the anus and advanced to the the terminal ileum,                            with identification of the appendiceal orifice and  IC valve. The colonoscopy was performed without                            difficulty. The patient tolerated the procedure                            well. The quality of the bowel preparation was                            evaluated using the BBPS [facility] Bowel Preparation                            Scale) with scores of: Right Colon = 3, Transverse                            Colon = 3 and  Left Colon = 3 (entire mucosa seen                            well with no residual staining, small fragments of                            stool or opaque liquid). The total BBPS score                            equals 9. Scope In: 1:16:25 PM Scope Out: 1:25:43 PM Scope Withdrawal Time: 0 hours 6 minutes 45 seconds  Total Procedure Duration: 0 hours 9 minutes 18 seconds  Findings:      The perianal and digital rectal examinations were normal.      Non-bleeding internal hemorrhoids were found during endoscopy.      A 4 mm polyp was found in the descending colon. The polyp was sessile.       The polyp was removed with a cold snare. Resection and retrieval were       complete.      The terminal ileum appeared normal. Impression:               - Non-bleeding internal hemorrhoids.                           - One 4 mm polyp in the descending colon, removed                            with a cold snare. Resected and retrieved.                           - The examined portion of the ileum was normal. Moderate Sedation:      Per Anesthesia Care Recommendation:           - Patient has a contact number available for                            emergencies. The signs and symptoms of potential  delayed complications were discussed with the                            patient. Return to normal activities tomorrow.                            Written discharge instructions were provided to the                            patient.                           - Resume previous diet.                           - Continue present medications.                           - Await pathology results.                           - Repeat colonoscopy in 5-10 years for surveillance.                           - Return to GI clinic in 3 months. Procedure Code(s):        --- Professional ---                           [phone], Colonoscopy, flexible; with removal of                             tumor(s), polyp(s), or other lesion(s) by snare                            technique Diagnosis Code(s):        --- Professional ---                           K63.5, Polyp of colon                           K64.8, Other hemorrhoids                           R10.32, Left lower quadrant pain                           R19.4, Change in bowel habit CPT copyright 2019 American [employer] Association. All rights reserved. The codes documented in this report are preliminary and upon coder review may  be revised to meet current compliance requirements. Elon Alas. Abbey Chatters, DO [place] Abbey Chatters, DO 10/15/2021 1:29:36 PM This report has been signed electronically. Number of Addenda: 0

## 2021-10-15 NOTE — Op Note (Signed)
[facility] Patient Name: Richard Clarke Procedure Date: 10/15/2021 12:56 PM MRN: [id number] Date of Birth: February 09, 1986 Attending MD: Elon Alas. Abbey Chatters DO CSN: [id number] Age: 36 Admit Type: Outpatient Procedure:                Upper GI endoscopy Indications:              Epigastric abdominal pain, Heartburn, Nausea Providers:                Elon Alas. Abbey Chatters, DO, Janeece Riggers, RN, Charlsie Quest                            Theda Sers RN, RN, Everardo Pacific, Aram Candela Referring MD:              Medicines:                See the Anesthesia note for documentation of the                            administered medications Complications:            No immediate complications. Estimated Blood Loss:     Estimated blood loss was minimal. Procedure:                Pre-Anesthesia Assessment:                           - The anesthesia plan was to use monitored                            anesthesia care (MAC).                           After obtaining informed consent, the endoscope was                            passed under direct vision. Throughout the                            procedure, the patient's blood pressure, pulse, and                            oxygen saturations were monitored continuously. The                            [id number]-H190 ([id number]) scope was introduced through the                            mouth, and advanced to the second part of duodenum.                            The upper GI endoscopy was accomplished without                            difficulty. The patient tolerated the procedure  well. Scope In: 1:09:27 PM Scope Out: 1:12:17 PM Total Procedure Duration: 0 hours 2 minutes 50 seconds  Findings:      There is no endoscopic evidence of bleeding, areas of erosion,       esophagitis, ulcerations or varices in the entire esophagus.      Patchy mild inflammation characterized by erythema was found in the       entire examined stomach.  Biopsies were taken with a cold forceps for       Helicobacter pylori testing.      The duodenal bulb, first portion of the duodenum and second portion of       the duodenum were normal. Impression:               - Gastritis. Biopsied.                           - Normal duodenal bulb, first portion of the                            duodenum and second portion of the duodenum. Moderate Sedation:      Per Anesthesia Care Recommendation:           - Patient has a contact number available for                            emergencies. The signs and symptoms of potential                            delayed complications were discussed with the                            patient. Return to normal activities tomorrow.                            Written discharge instructions were provided to the                            patient.                           - Resume previous diet.                           - Continue present medications.                           - Await pathology results.                           - Use a proton pump inhibitor PO BID for 12 weeks.                           - Return to GI clinic in 3 months. Procedure Code(s):        --- Professional ---                           [phone], Esophagogastroduodenoscopy, flexible,  transoral; with biopsy, single or multiple Diagnosis Code(s):        --- Professional ---                           K29.70, Gastritis, unspecified, without bleeding                           R10.13, Epigastric pain                           R12, Heartburn                           R11.0, Nausea CPT copyright 2019 American Medical Association. All rights reserved. The codes documented in this report are preliminary and upon coder review may  be revised to meet current compliance requirements. Elon Alas. Abbey Chatters, DO [place] Abbey Chatters, DO 10/15/2021 1:14:35 PM This report has been signed electronically. Number of Addenda: 0

## 2021-10-15 NOTE — Anesthesia Preprocedure Evaluation (Signed)
Anesthesia Evaluation  Patient identified by MRN, date of birth, ID band Patient awake    Reviewed: Allergy & Precautions, NPO status , Patient's Chart, lab work & pertinent test results  Airway Mallampati: II  TM Distance: >3 FB Neck ROM: Full    Dental  (+) Dental Advisory Given   Pulmonary asthma ,    Pulmonary exam normal breath sounds clear to auscultation       Cardiovascular negative cardio ROS Normal cardiovascular exam Rhythm:Regular Rate:Normal     Neuro/Psych negative neurological ROS  negative psych ROS   GI/Hepatic Neg liver ROS, GERD  Medicated,  Endo/Other  negative endocrine ROS  Renal/GU negative Renal ROS  negative genitourinary   Musculoskeletal negative musculoskeletal ROS (+)   Abdominal   Peds negative pediatric ROS (+)  Hematology negative hematology ROS (+)   Anesthesia Other Findings   Reproductive/Obstetrics negative OB ROS                            Anesthesia Physical Anesthesia Plan  ASA: 2  Anesthesia Plan: General   Post-op Pain Management: Minimal or no pain anticipated   Induction: Intravenous  PONV Risk Score and Plan: Propofol infusion  Airway Management Planned: Nasal Cannula and Natural Airway  Additional Equipment:   Intra-op Plan:   Post-operative Plan:   Informed Consent: I have reviewed the patients History and Physical, chart, labs and discussed the procedure including the risks, benefits and alternatives for the proposed anesthesia with the patient or authorized representative who has indicated his/her understanding and acceptance.     Dental advisory given  Plan Discussed with: CRNA and Surgeon  Anesthesia Plan Comments:         Anesthesia Quick Evaluation

## 2021-10-15 NOTE — H&P (Signed)
Primary Care Physician:  Tylene Fantasia., PA-C Primary Gastroenterologist:  Dr. Marletta Lor  Pre-Procedure History & Physical: HPI:  Richard Clarke is a 35 y.o. male is here for an EGD for GERD, epigastric pain, chronic nausea, and a colonoscopy for LLQ abdominal pain and change in bowel habits.   Past Medical History:  Diagnosis Date   Asthma     Past Surgical History:  Procedure Laterality Date   None      Prior to Admission medications   Medication Sig Start Date End Date Taking? Authorizing Provider  acetaminophen (TYLENOL) 500 MG tablet Take 1,000 mg by mouth every 6 (six) hours as needed for moderate pain.   Yes [provider]  albuterol (PROVENTIL HFA;VENTOLIN HFA) 108 (90 BASE) MCG/ACT inhaler Inhale 2 puffs into the lungs every 6 (six) hours as needed for wheezing.   Yes [provider]  cetirizine (ZYRTEC) 10 MG tablet Take 10 mg by mouth daily as needed for allergies.   Yes [provider]  famotidine (PEPCID) 20 MG tablet Take 20 mg by mouth at bedtime as needed for heartburn.   Yes [provider]  fluticasone (FLONASE) 50 MCG/ACT nasal spray Place 2 sprays into both nostrils 2 (two) times daily.   Yes [provider]  ibuprofen (ADVIL) 800 MG tablet Take 1 tablet (800 mg total) by mouth every 6 (six) hours as needed for moderate pain. 04/15/21  Yes Pollina, Canary Brim, MD  Multiple Vitamins-Minerals (ADULT GUMMY PO) Take 2 capsules by mouth daily.   Yes [provider]  omeprazole (PRILOSEC) 40 MG capsule Take 40 mg by mouth daily before breakfast.   Yes [provider]  desonide (DESOWEN) 0.05 % lotion Apply 1 Application topically daily as needed for rash. 09/28/21   [provider]    Allergies as of 09/12/2021 - Review Complete 09/12/2021  Allergen Reaction Noted   Levaquin [levofloxacin] Rash 08/08/2015    Family History  Problem Relation Age of Onset   Throat cancer Father    Colon  cancer Maternal Aunt        passed away at age 56    Colon polyps Neg Hx     Social History   Socioeconomic History   Marital status: Single    Spouse name: Not on file   Number of children: Not on file   Years of education: Not on file   Highest education level: Not on file  Occupational History   Not on file  Tobacco Use   Smoking status: Never    Passive exposure: Never   Smokeless tobacco: Never  Vaping Use   Vaping Use: Never used  Substance and Sexual Activity   Alcohol use: Not Currently    Comment: Occ   Drug use: Not Currently    Types: Marijuana    Comment: helps with appetite    Sexual activity: Yes  Other Topics Concern   Not on file  Social History Narrative   Not on file   Social Determinants of Health   Financial Resource Strain: Not on file  Food Insecurity: Not on file  Transportation Needs: Not on file  Physical Activity: Not on file  Stress: Not on file  Social Connections: Not on file  Intimate Partner Violence: Not on file    Review of Systems: See HPI, otherwise negative ROS  Physical Exam: Vital signs in last 24 hours: Temp:  [98 F (36.7 C)] 98 F (36.7 C) (08/14 1157) Pulse Rate:  [49]  49 (08/14 1157) Resp:  [10] 10 (08/14 1157) BP: (116)/(71) 116/71 (08/14 1157) SpO2:  [100 %] 100 % (08/14 1157) Weight:  [63.5 kg] 63.5 kg (08/14 1157)   General:   Alert,  Well-developed, well-nourished, pleasant and cooperative in NAD Head:  Normocephalic and atraumatic. Eyes:  Sclera clear, no icterus.   Conjunctiva pink. Ears:  Normal auditory acuity. Nose:  No deformity, discharge,  or lesions. Mouth:  No deformity or lesions, dentition normal. Neck:  Supple; no masses or thyromegaly. Lungs:  Clear throughout to auscultation.   No wheezes, crackles, or rhonchi. No acute distress. Heart:  Regular rate and rhythm; no murmurs, clicks, rubs,  or gallops. Abdomen:  Soft, nontender and nondistended. No masses, hepatosplenomegaly or hernias  noted. Normal bowel sounds, without guarding, and without rebound.   Msk:  Symmetrical without gross deformities. Normal posture. Extremities:  Without clubbing or edema. Neurologic:  Alert and  oriented x4;  grossly normal neurologically. Skin:  Intact without significant lesions or rashes. Cervical Nodes:  No significant cervical adenopathy. Psych:  Alert and cooperative. Normal mood and affect.  Impression/Plan: Richard Clarke is here for an EGD for GERD, epigastric pain, chronic nausea, and a colonoscopy for LLQ abdominal pain and change in bowel habits.   The risks of the procedure including infection, bleed, or perforation as well as benefits, limitations, alternatives and imponderables have been reviewed with the patient. Questions have been answered. All parties agreeable.

## 2021-10-16 LAB — SURGICAL PATHOLOGY

## 2021-10-18 ENCOUNTER — Encounter (HOSPITAL_COMMUNITY): Payer: Self-pay | Admitting: Internal Medicine

## 2022-02-12 ENCOUNTER — Encounter: Payer: Self-pay | Admitting: Internal Medicine

## 2022-04-16 ENCOUNTER — Emergency Department (HOSPITAL_COMMUNITY)
Admission: EM | Admit: 2022-04-16 | Discharge: 2022-04-16 | Disposition: A | Discharge status: Home / Self Care | Payer: Commercial Managed Care - PPO | Attending: Emergency Medicine | Admitting: Emergency Medicine

## 2022-04-16 ENCOUNTER — Other Ambulatory Visit: Payer: Self-pay

## 2022-04-16 ENCOUNTER — Encounter (HOSPITAL_COMMUNITY): Payer: Self-pay | Admitting: *Deleted

## 2022-04-16 DIAGNOSIS — G43809 Other migraine, not intractable, without status migrainosus: Secondary | ICD-10-CM | POA: Insufficient documentation

## 2022-04-16 DIAGNOSIS — R519 Headache, unspecified: Secondary | ICD-10-CM | POA: Diagnosis present

## 2022-04-16 HISTORY — DX: Migraine, unspecified, not intractable, without status migrainosus: G43.909

## 2022-04-16 MED ORDER — NAPROXEN 250 MG PO TABS
500.0000 mg | ORAL_TABLET | Freq: Once | ORAL | Status: AC
  Administered 2022-04-16: 500 mg via ORAL
  Filled 2022-04-16: qty 2

## 2022-04-16 MED ORDER — ONDANSETRON 4 MG PO TBDP: 4.0000 mg | ORAL_TABLET | Freq: Three times a day (TID) | ORAL | 0 refills | Status: DC | PRN

## 2022-04-16 MED ORDER — SUMATRIPTAN SUCCINATE 50 MG PO TABS: 50.0000 mg | ORAL_TABLET | ORAL | 0 refills | Status: DC | PRN

## 2022-04-16 MED ORDER — NAPROXEN 500 MG PO TABS: 500.0000 mg | ORAL_TABLET | Freq: Two times a day (BID) | ORAL | 0 refills | Status: DC

## 2022-04-16 MED ORDER — ONDANSETRON 4 MG PO TBDP
4.0000 mg | ORAL_TABLET | Freq: Once | ORAL | Status: AC
  Administered 2022-04-16: 4 mg via ORAL
  Filled 2022-04-16: qty 1

## 2022-04-16 NOTE — ED Provider Notes (Signed)
[facility] Provider Note   CSN: [id number] Arrival date & time: 04/16/22  1435     History  Chief Complaint  Patient presents with   Migraine    Richard Clarke is a 37 y.o. male.   Migraine     This patient is a 37 year old male, denies chronic medical conditions, states he does not take any daily medicines, he has been taking Tylenol every day for headaches.  The headache started a couple of months ago, he has good days and bad days, sometimes there are no headaches, sometimes the headache lasts all day.  Sometimes they wake him up at night but not every night.  He does not have any neurologic symptoms with it including no changes in vision numbness weakness or difficulty ambulating.  His speech has never been affected.  He does not have fevers or stiff neck.  He does have some light sensitivity and sound sensitivity and nausea when they do occur.  He has never been formally diagnosed with a migraine headache.  Home Medications Prior to Admission medications   Medication Sig Start Date End Date Taking? Authorizing Provider  naproxen (NAPROSYN) 500 MG tablet Take 1 tablet (500 mg total) by mouth 2 (two) times daily with a meal. 04/16/22  Yes Noemi Chapel, MD  ondansetron (ZOFRAN-ODT) 4 MG disintegrating tablet Take 1 tablet (4 mg total) by mouth every 8 (eight) hours as needed for nausea. 04/16/22  Yes Noemi Chapel, MD  SUMAtriptan (IMITREX) 50 MG tablet Take 1 tablet (50 mg total) by mouth every 2 (two) hours as needed for migraine. May repeat in 2 hours if headache persists or recurs. 04/16/22  Yes Noemi Chapel, MD  acetaminophen (TYLENOL) 500 MG tablet Take 1,000 mg by mouth every 6 (six) hours as needed for moderate pain.    [provider]  albuterol (PROVENTIL HFA;VENTOLIN HFA) 108 (90 BASE) MCG/ACT inhaler Inhale 2 puffs into the lungs every 6 (six) hours as needed for wheezing.    [provider]  cetirizine  (ZYRTEC) 10 MG tablet Take 10 mg by mouth daily as needed for allergies.    [provider]  desonide (DESOWEN) 0.05 % lotion Apply 1 Application topically daily as needed for rash. 09/28/21   [provider]  famotidine (PEPCID) 20 MG tablet Take 20 mg by mouth at bedtime as needed for heartburn.    [provider]  fluticasone (FLONASE) 50 MCG/ACT nasal spray Place 2 sprays into both nostrils 2 (two) times daily.    [provider]  ibuprofen (ADVIL) 800 MG tablet Take 1 tablet (800 mg total) by mouth every 6 (six) hours as needed for moderate pain. 04/15/21   Orpah Greek, MD  Multiple Vitamins-Minerals (ADULT GUMMY PO) Take 2 capsules by mouth daily.    [provider]  pantoprazole (PROTONIX) 40 MG tablet Take 1 tablet (40 mg total) by mouth 2 (two) times daily before a meal. 10/15/21 10/15/22  Eloise Harman, DO      Allergies    Levaquin [levofloxacin]    Review of Systems   Review of Systems  All other systems reviewed and are negative.   Physical Exam Updated Vital Signs BP 105/89 (BP Location: Right Arm)   Pulse (!) 56   Temp 98.3 F (36.8 C) (Oral)   Resp 16   Wt 63.5 kg   SpO2 100%   BMI 20.67 kg/m  Physical Exam Vitals and nursing note reviewed.  Constitutional:      General: He is not in acute distress.    Appearance: He is well-developed.  HENT:     Head: Normocephalic and atraumatic.     Mouth/Throat:     Pharynx: No oropharyngeal exudate.  Eyes:     General: No scleral icterus.       Right eye: No discharge.        Left eye: No discharge.     Conjunctiva/sclera: Conjunctivae normal.     Pupils: Pupils are equal, round, and reactive to light.  Neck:     Thyroid: No thyromegaly.     Vascular: No JVD.  Cardiovascular:     Rate and Rhythm: Normal rate and regular rhythm.     Heart sounds: Normal heart sounds. No murmur heard.    No friction rub. No gallop.  Pulmonary:     Effort: Pulmonary effort is  normal. No respiratory distress.     Breath sounds: Normal breath sounds. No wheezing or rales.  Abdominal:     General: Bowel sounds are normal. There is no distension.     Palpations: Abdomen is soft. There is no mass.     Tenderness: There is no abdominal tenderness.  Musculoskeletal:        General: No tenderness. Normal range of motion.     Cervical back: Normal range of motion and neck supple.  Lymphadenopathy:     Cervical: No cervical adenopathy.  Skin:    General: Skin is warm and dry.     Findings: No erythema or rash.  Neurological:     Mental Status: He is alert.     Coordination: Coordination normal.     Comments: Speech is clear, cranial nerves III through XII are intact, memory is intact, strength is normal in all 4 extremities including grips, sensation is intact to light touch and pinprick in all 4 extremities. Coordination as tested by finger-nose-finger is normal, no limb ataxia. Normal gait, normal reflexes at the patellar tendons bilaterally  Psychiatric:        Behavior: Behavior normal.     ED Results / Procedures / Treatments   Labs (all labs ordered are listed, but only abnormal results are displayed) Labs Reviewed - No data to display  EKG None  Radiology No results found.  Procedures Procedures    Medications Ordered in ED Medications  naproxen (NAPROSYN) tablet 500 mg (has no administration in time range)  ondansetron (ZOFRAN-ODT) disintegrating tablet 4 mg (has no administration in time range)    ED Course/ Medical Decision Making/ A&P                             Medical Decision Making Risk Prescription drug management.   Patient has classic migraine symptoms, bitemporal headache with associated nausea and photophobia, some days are better than others, some days are completely pain-free and there is never neurologic symptoms, recommended anti-inflammatories, antiemetics and a course of Imitrex.  Can follow-up with primary care doctor  if not improving.  I do not think the patient needs a CT scan or labs as they would not be very valuable.  His vitals are unremarkable        Final Clinical Impression(s) / ED Diagnoses Final diagnoses:  Other migraine without status migrainosus, not intractable    Rx / DC Orders ED Discharge Orders          Ordered    naproxen (NAPROSYN) 500 MG  tablet  2 times daily with meals        04/16/22 1526    ondansetron (ZOFRAN-ODT) 4 MG disintegrating tablet  Every 8 hours PRN        04/16/22 1526    SUMAtriptan (IMITREX) 50 MG tablet  Every 2 hours PRN        04/16/22 1526              Noemi Chapel, MD 04/16/22 1528

## 2022-04-16 NOTE — Discharge Instructions (Signed)
Please see the follow-up list below if you do not have a family doctor.  I want you to take Naprosyn twice a day, Zofran every 6 hours as needed for nausea, Imitrex may be taken up to 2 pills in the day for severe headaches that are not going away however if you are still experiencing severe pain or worsening symptoms return to the emergency department immediately.  Thank you for allowing [place] to treat you in the emergency department today.  After reviewing your examination and potential testing that was done it appears that you are safe to go home.  I would like for you to follow-up with your doctor within the next several days, have them obtain your results and follow-up with them to review all of these tests.  If you should develop severe or worsening symptoms return to the emergency department immediately  [facility] Primary Care Doctor List    Tula Nakayama, MD. Specialty: [facility] Contact information: [address], [address] [place] [address]  [phone]   Sallee Lange, MD. Specialty: [facility] Contact information: [address]  [place] [address]  [phone]   Rosita Fire, MD Specialty: Internal Medicine Contact information: [place] [address]  [phone]   Delphina Cahill, MD. Specialty: Internal Medicine Contact information: [place] [address]  [phone]    [facility] (Dr. Maudie Mercury) Specialty: Family Medicine Contact information: [place] [address]  [phone]   Leslie Andrea, MD. Specialty: [facility] Contact information: [place] [address]  [phone]   Asencion Noble, MD. Specialty: Internal Medicine Contact information: [address]  [place] [address]  [place]  [address] [place], [place] [address] [phone]  Services [facility] offers a variety of basic health services.  Services include but are not limited to: Blood pressure checks  Heart rate checks  Blood sugar checks  Urine analysis  Rapid strep tests  Pregnancy tests.  Health education and referrals  People needing more complex services will be directed to a physician online. Using these virtual visits, doctors can evaluate and prescribe medicine and treatments. There will be no medication on-site, though [facility] will help patients fill their prescriptions at little to no cost.   For More information please go to: [email or web address]

## 2022-04-16 NOTE — ED Triage Notes (Signed)
Pt is here for evaluation of migraine which he woke up with.  Pt has been getting migraines and was seen by health department and given muscle relaxers which made him very tired.  Pt has sensitivity to light and sound as well as nausea.  Pain on left side and feels sharp and throbbing.

## 2022-04-16 NOTE — ED Notes (Signed)
Pt ambulatory to waiting room. Pt verbalized understanding of discharge instructions.   

## 2022-06-10 ENCOUNTER — Other Ambulatory Visit: Payer: Self-pay

## 2022-06-10 ENCOUNTER — Encounter (HOSPITAL_BASED_OUTPATIENT_CLINIC_OR_DEPARTMENT_OTHER): Payer: Self-pay | Admitting: Otolaryngology

## 2022-06-12 NOTE — H&P (Signed)
HPI:  Richard Clarke is a 37 y.o. male who presents as a consult Patient.  Referring Provider: Quentin Ore*  Chief complaint: Tonsil stones.  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 He gets them frequently and they cause bad breath and inflammation in his throat. He cleans them out with mouthwash and massage. Otherwise in pretty good health. He has some sort of inflammatory condition of his facial skin. He has been on doxycycline for that. This seems to keep his tonsil stones a little bit less inflamed.  PMH/Meds/All/SocHx/FamHx/ROS:  Past Medical History: Diagnosis Date Asthma  History reviewed. No pertinent surgical history.  No family history of bleeding disorders, wound healing problems or difficulty with anesthesia.    Current Outpatient Medications: cetirizine (ZyrTEC) 10 mg tablet, TAKE 1 TABLET BY MOUTH DAILY AS NEEDED FOR RUNNY NOSE OR ALLERGY SYMPTOMS, Disp: , Rfl: desonide (DESOWEN) 0.05 % lotion, Apply 1 Application topically., Disp: , Rfl: famotidine (PEPCID) 20 mg tablet, Take 20 mg by mouth., Disp: , Rfl: fluticasone propionate (FLONASE) 50 mcg/spray nasal spray, SHAKE LIQUID AND USE 1 SPRAY IN EACH NOSTRIL TWICE DAILY AS NEEDED FOR NASAL CONGESTION, Disp: , Rfl: montelukast (SINGULAIR) 10 mg tablet, TAKE 1 TABLET BY MOUTH EVERY NIGHT FOR ASTHMA, Disp: , Rfl: pantoprazole (PROTONIX) 40 mg EC tablet, Take 40 mg by mouth., Disp: , Rfl: SUMAtriptan (IMITREX) 50 mg tablet, Take 50 mg by mouth., Disp: , Rfl:  A complete ROS was performed with pertinent positives/negatives noted in the HPI. The remainder of the ROS are negative.   Physical Exam:  Pulse 70  Temp 97.6 F (36.4 C) (Temporal)  Resp 18  Ht 1.702 m (5\' 7" )  Wt 63 kg (139 lb)  SpO2 97%  BMI 21.77 kg/m  General: Healthy and alert, in no distress, breathing easily. Normal affect. In a pleasant mood. Head: Normocephalic, atraumatic. No masses, or scars. Eyes:  Pupils are equal, and reactive to light. Vision is grossly intact. No spontaneous or gaze nystagmus. Ears: Ear canals are clear. Tympanic membranes are intact, with normal landmarks and the middle ears are clear and healthy. Hearing: Grossly normal. Nose: Nasal cavities are clear with healthy mucosa, no polyps or exudate. Airways are patent. Face: No masses or scars, facial nerve function is symmetric. Oral Cavity: No mucosal abnormalities are noted. Tongue with normal mobility. Dentition appears healthy. Oropharynx: Tonsils are symmetric. There are no mucosal masses identified. Tongue base appears normal and healthy. Larynx/Hypopharynx: deferred Chest: Deferred Neck: No palpable masses, no cervical adenopathy, no thyroid nodules or enlargement. Neuro: Cranial nerves II-XII with normal function. Balance: Normal gate. Other findings: none.  Independent Review of Additional Tests or Records: none  Procedures: none  Impression & Plans: Normal examination today. Recommend aggressive gargling with salt water and use of a Waterpik. Tonsillectomy as a last resort but he is very interested in tonsillectomy so we will have to deal with this any longer.Syere meets the indications for tonsillectomy. Risks and benefits were discussed in detail. All questions were answered. A handout was provided with additional details.

## 2022-06-17 ENCOUNTER — Ambulatory Visit (HOSPITAL_BASED_OUTPATIENT_CLINIC_OR_DEPARTMENT_OTHER): Payer: Commercial Managed Care - PPO | Admitting: Anesthesiology

## 2022-06-17 ENCOUNTER — Encounter (HOSPITAL_BASED_OUTPATIENT_CLINIC_OR_DEPARTMENT_OTHER)
Admission: RE | Disposition: A | Discharge status: Home / Self Care | Payer: Self-pay | Source: Ambulatory Visit | Attending: Otolaryngology

## 2022-06-17 ENCOUNTER — Other Ambulatory Visit: Payer: Self-pay

## 2022-06-17 ENCOUNTER — Encounter (HOSPITAL_BASED_OUTPATIENT_CLINIC_OR_DEPARTMENT_OTHER): Payer: Self-pay | Admitting: Otolaryngology

## 2022-06-17 ENCOUNTER — Ambulatory Visit (HOSPITAL_BASED_OUTPATIENT_CLINIC_OR_DEPARTMENT_OTHER)
Admission: RE | Admit: 2022-06-17 | Discharge: 2022-06-17 | Disposition: A | Discharge status: Home / Self Care | Payer: Commercial Managed Care - PPO | Source: Ambulatory Visit | Attending: Otolaryngology | Admitting: Otolaryngology

## 2022-06-17 DIAGNOSIS — K219 Gastro-esophageal reflux disease without esophagitis: Secondary | ICD-10-CM | POA: Diagnosis not present

## 2022-06-17 DIAGNOSIS — J45909 Unspecified asthma, uncomplicated: Secondary | ICD-10-CM | POA: Diagnosis not present

## 2022-06-17 DIAGNOSIS — Z79899 Other long term (current) drug therapy: Secondary | ICD-10-CM | POA: Diagnosis not present

## 2022-06-17 DIAGNOSIS — J358 Other chronic diseases of tonsils and adenoids: Secondary | ICD-10-CM | POA: Diagnosis present

## 2022-06-17 HISTORY — DX: Other chronic diseases of tonsils and adenoids: J35.8

## 2022-06-17 HISTORY — PX: TONSILLECTOMY: SHX5217

## 2022-06-17 HISTORY — DX: Gastro-esophageal reflux disease without esophagitis: K21.9

## 2022-06-17 SURGERY — TONSILLECTOMY
Anesthesia: General | Site: Throat | Laterality: Bilateral

## 2022-06-17 MED ORDER — LACTATED RINGERS IV SOLN: INTRAVENOUS | Status: DC

## 2022-06-17 MED ORDER — ONDANSETRON 4 MG PO TBDP: 4.0000 mg | ORAL_TABLET | Freq: Three times a day (TID) | ORAL | 0 refills | Status: DC | PRN

## 2022-06-17 MED ORDER — MIDAZOLAM HCL 2 MG/2ML IJ SOLN
INTRAMUSCULAR | Status: AC
  Filled 2022-06-17: qty 2

## 2022-06-17 MED ORDER — OXYCODONE HCL 5 MG PO TABS
5.0000 mg | ORAL_TABLET | Freq: Once | ORAL | Status: AC | PRN
  Administered 2022-06-17: 5 mg via ORAL

## 2022-06-17 MED ORDER — HYDROMORPHONE HCL 1 MG/ML IJ SOLN
INTRAMUSCULAR | Status: AC
  Filled 2022-06-17: qty 0.5

## 2022-06-17 MED ORDER — FENTANYL CITRATE (PF) 100 MCG/2ML IJ SOLN
INTRAMUSCULAR | Status: AC
  Filled 2022-06-17: qty 2

## 2022-06-17 MED ORDER — ONDANSETRON HCL 4 MG/2ML IJ SOLN
INTRAMUSCULAR | Status: DC | PRN
  Administered 2022-06-17: 4 mg via INTRAVENOUS

## 2022-06-17 MED ORDER — MIDAZOLAM HCL 5 MG/5ML IJ SOLN
INTRAMUSCULAR | Status: DC | PRN
  Administered 2022-06-17: 2 mg via INTRAVENOUS

## 2022-06-17 MED ORDER — ROCURONIUM BROMIDE 100 MG/10ML IV SOLN
INTRAVENOUS | Status: DC | PRN
  Administered 2022-06-17: 60 mg via INTRAVENOUS

## 2022-06-17 MED ORDER — FENTANYL CITRATE (PF) 100 MCG/2ML IJ SOLN
INTRAMUSCULAR | Status: DC | PRN
  Administered 2022-06-17: 100 ug via INTRAVENOUS

## 2022-06-17 MED ORDER — DEXAMETHASONE SODIUM PHOSPHATE 4 MG/ML IJ SOLN
INTRAMUSCULAR | Status: DC | PRN
  Administered 2022-06-17: 10 mg via INTRAVENOUS

## 2022-06-17 MED ORDER — LIDOCAINE HCL (CARDIAC) PF 100 MG/5ML IV SOSY
PREFILLED_SYRINGE | INTRAVENOUS | Status: DC | PRN
  Administered 2022-06-17: 60 mg via INTRAVENOUS

## 2022-06-17 MED ORDER — SUGAMMADEX SODIUM 200 MG/2ML IV SOLN
INTRAVENOUS | Status: DC | PRN
  Administered 2022-06-17: 200 mg via INTRAVENOUS

## 2022-06-17 MED ORDER — HYDROMORPHONE HCL 1 MG/ML IJ SOLN
0.2500 mg | INTRAMUSCULAR | Status: DC | PRN
  Administered 2022-06-17: 0.5 mg via INTRAVENOUS
  Administered 2022-06-17: 0.25 mg via INTRAVENOUS
  Administered 2022-06-17: 0.5 mg via INTRAVENOUS
  Administered 2022-06-17: 0.25 mg via INTRAVENOUS
  Administered 2022-06-17: 0.5 mg via INTRAVENOUS

## 2022-06-17 MED ORDER — HYDROCODONE-ACETAMINOPHEN 7.5-325 MG/15ML PO SOLN: 15.0000 mL | Freq: Four times a day (QID) | ORAL | 0 refills | Status: DC | PRN

## 2022-06-17 MED ORDER — OXYCODONE HCL 5 MG PO TABS
ORAL_TABLET | ORAL | Status: AC
  Filled 2022-06-17: qty 1

## 2022-06-17 MED ORDER — PROPOFOL 10 MG/ML IV BOLUS
INTRAVENOUS | Status: DC | PRN
  Administered 2022-06-17: 200 mg via INTRAVENOUS

## 2022-06-17 MED ORDER — OXYCODONE HCL 5 MG/5ML PO SOLN: 5.0000 mg | Freq: Once | ORAL | Status: AC | PRN

## 2022-06-17 MED ORDER — ONDANSETRON HCL 4 MG/2ML IJ SOLN: 4.0000 mg | Freq: Once | INTRAMUSCULAR | Status: DC | PRN

## 2022-06-17 SURGICAL SUPPLY — 31 items
CANISTER SUCT 1200ML W/VALVE (MISCELLANEOUS) ×1 IMPLANT
CATH ROBINSON RED A/P 12FR (CATHETERS) ×1 IMPLANT
CLEANER CAUTERY TIP 5X5 PAD (MISCELLANEOUS) ×1 IMPLANT
COAGULATOR SUCT SWTCH 10FR 6 (ELECTROSURGICAL) ×1 IMPLANT
COVER BACK TABLE 60X90IN (DRAPES) ×1 IMPLANT
COVER MAYO STAND STRL (DRAPES) ×1 IMPLANT
DEFOGGER MIRROR 1QT (MISCELLANEOUS) IMPLANT
ELECT COATED BLADE 2.86 ST (ELECTRODE) ×1 IMPLANT
ELECT REM PT RETURN 9FT ADLT (ELECTROSURGICAL) ×1
ELECT REM PT RETURN 9FT PED (ELECTROSURGICAL)
ELECTRODE REM PT RETRN 9FT PED (ELECTROSURGICAL) IMPLANT
ELECTRODE REM PT RTRN 9FT ADLT (ELECTROSURGICAL) IMPLANT
GAUZE SPONGE 4X4 12PLY STRL LF (GAUZE/BANDAGES/DRESSINGS) ×1 IMPLANT
GLOVE ECLIPSE 7.5 STRL STRAW (GLOVE) ×1 IMPLANT
GLOVE SURG SYN 8.0 (GLOVE) ×1 IMPLANT
GLOVE SURG SYN 8.0 PF PI (GLOVE) IMPLANT
GOWN STRL REUS W/ TWL LRG LVL3 (GOWN DISPOSABLE) ×2 IMPLANT
GOWN STRL REUS W/ TWL XL LVL3 (GOWN DISPOSABLE) IMPLANT
GOWN STRL REUS W/TWL LRG LVL3 (GOWN DISPOSABLE) ×1
GOWN STRL REUS W/TWL XL LVL3 (GOWN DISPOSABLE) ×2
MARKER SKIN DUAL TIP RULER LAB (MISCELLANEOUS) IMPLANT
NS IRRIG 1000ML POUR BTL (IV SOLUTION) ×1 IMPLANT
PENCIL FOOT CONTROL (ELECTRODE) ×1 IMPLANT
SHEET MEDIUM DRAPE 40X70 STRL (DRAPES) ×1 IMPLANT
SPONGE TONSIL 1 RF SGL (DISPOSABLE) IMPLANT
SPONGE TONSIL 1.25 RF SGL STRG (GAUZE/BANDAGES/DRESSINGS) IMPLANT
SYR BULB EAR ULCER 3OZ GRN STR (SYRINGE) ×1 IMPLANT
TOWEL GREEN STERILE FF (TOWEL DISPOSABLE) ×1 IMPLANT
TUBE CONNECTING 20X1/4 (TUBING) ×1 IMPLANT
TUBE SALEM SUMP 12F (TUBING) IMPLANT
TUBE SALEM SUMP 16F (TUBING) IMPLANT

## 2022-06-17 NOTE — Anesthesia Postprocedure Evaluation (Signed)
Anesthesia Post Note  Patient: Richard Clarke  Procedure(s) Performed: TONSILLECTOMY (Bilateral: Throat)     Patient location during evaluation: PACU Anesthesia Type: General Level of consciousness: awake and alert and oriented Pain management: pain level controlled Vital Signs Assessment: post-procedure vital signs reviewed and stable Respiratory status: spontaneous breathing, nonlabored ventilation and respiratory function stable Cardiovascular status: blood pressure returned to baseline and stable Postop Assessment: no apparent nausea or vomiting Anesthetic complications: no   No notable events documented.  Last Vitals:  Vitals:   06/17/22 1030 06/17/22 1045  BP: 132/85 128/87  Pulse: 71 69  Resp: 12 13  Temp:    SpO2: 100% 97%    Last Pain:  Vitals:   06/17/22 1051  TempSrc:   PainSc: 8                  Binh Doten A.

## 2022-06-17 NOTE — Anesthesia Preprocedure Evaluation (Signed)
Anesthesia Evaluation  Patient identified by MRN, date of birth, ID band Patient awake    Reviewed: Allergy & Precautions, NPO status , Patient's Chart, lab work & pertinent test results  Airway Mallampati: II  TM Distance: >3 FB Neck ROM: Full Positive for:  Tracheal deviation   Dental no notable dental hx. (+) Teeth Intact, Dental Advisory Given   Pulmonary asthma  Tonsillar calculus   Pulmonary exam normal breath sounds clear to auscultation       Cardiovascular negative cardio ROS Normal cardiovascular exam Rhythm:Regular Rate:Normal     Neuro/Psych  Headaches  negative psych ROS   GI/Hepatic Neg liver ROS,GERD  Medicated,,  Endo/Other  negative endocrine ROS    Renal/GU negative Renal ROS  negative genitourinary   Musculoskeletal negative musculoskeletal ROS (+)    Abdominal   Peds  Hematology negative hematology ROS (+)   Anesthesia Other Findings   Reproductive/Obstetrics                             Anesthesia Physical Anesthesia Plan  ASA: 2  Anesthesia Plan: General   Post-op Pain Management: Dilaudid IV   Induction: Intravenous  PONV Risk Score and Plan: 4 or greater and Treatment may vary due to age or medical condition  Airway Management Planned: Oral ETT  Additional Equipment: None  Intra-op Plan:   Post-operative Plan: Extubation in OR  Informed Consent: I have reviewed the patients History and Physical, chart, labs and discussed the procedure including the risks, benefits and alternatives for the proposed anesthesia with the patient or authorized representative who has indicated his/her understanding and acceptance.     Dental advisory given  Plan Discussed with: CRNA and Anesthesiologist  Anesthesia Plan Comments:        Anesthesia Quick Evaluation

## 2022-06-17 NOTE — Transfer of Care (Signed)
Immediate Anesthesia Transfer of Care Note  Patient: Richard Clarke  Procedure(s) Performed: TONSILLECTOMY (Bilateral: Throat)  Patient Location: PACU  Anesthesia Type:General  Level of Consciousness: awake, alert , and oriented  Airway & Oxygen Therapy: Patient Spontanous Breathing and Patient connected to face mask oxygen  Post-op Assessment: Report given to RN and Post -op Vital signs reviewed and stable  Post vital signs: Reviewed and stable  Last Vitals:  Vitals Value Taken Time  BP    Temp    Pulse    Resp    SpO2      Last Pain:  Vitals:   06/17/22 0821  TempSrc: Temporal  PainSc: 7       Patients Stated Pain Goal: 3 (06/17/22 0821)  Complications: No notable events documented.

## 2022-06-17 NOTE — Op Note (Signed)
06/17/2022  9:56 AM  PATIENT:  Richard Clarke  37 y.o. male  PRE-OPERATIVE DIAGNOSIS:  Tonsillar calculus  POST-OPERATIVE DIAGNOSIS:  Tonsillar calculus  PROCEDURE:  Procedure(s): TONSILLECTOMY  SURGEON:  Surgeon(s): [name], MD  ANESTHESIA:   General  COUNTS: Correct   DICTATION: The patient was taken to the operating room and placed on the operating table in the supine position. Following induction of general endotracheal anesthesia, the table was turned and the patient was draped in a standard fashion. A Crowe-Davis mouthgag was inserted into the oral cavity and used to retract the tongue and mandible, then attached to the Mayo stand.  The tonsillectomy was then performed using electrocautery dissection, carefully dissecting the avascular plane between the capsule and constrictor muscles. Cautery was used for completion of hemostasis. The tonsils were moderately enlarged and deeply cryptic , and were discarded.  The pharynx was irrigated with saline and suctioned. An oral gastric tube was used to aspirate the contents of the stomach. The patient was then awakened from anesthesia and transferred to PACU in stable condition.   PATIENT DISPOSITION:  To PACA, stable

## 2022-06-17 NOTE — Discharge Instructions (Signed)

## 2022-06-17 NOTE — Interval H&P Note (Signed)
History and Physical Interval Note:  06/17/2022 9:04 AM  Richard Clarke  has presented today for surgery, with the diagnosis of Tonsillar calculus.  The various methods of treatment have been discussed with the patient and family. After consideration of risks, benefits and other options for treatment, the patient has consented to  Procedure(s): TONSILLECTOMY (Bilateral) as a surgical intervention.  The patient's history has been reviewed, patient examined, no change in status, stable for surgery.  I have reviewed the patient's chart and labs.  Questions were answered to the patient's satisfaction.     [name]

## 2022-06-18 ENCOUNTER — Encounter (HOSPITAL_BASED_OUTPATIENT_CLINIC_OR_DEPARTMENT_OTHER): Payer: Self-pay | Admitting: Otolaryngology

## 2022-08-12 ENCOUNTER — Telehealth: Payer: Self-pay

## 2022-08-12 ENCOUNTER — Encounter: Payer: Self-pay | Admitting: Neurology

## 2022-08-12 ENCOUNTER — Other Ambulatory Visit (HOSPITAL_COMMUNITY): Payer: Self-pay

## 2022-08-12 ENCOUNTER — Ambulatory Visit: Payer: Commercial Managed Care - PPO | Admitting: Neurology

## 2022-08-12 VITALS — BP 116/61 | HR 52 | Ht 69.0 in | Wt 140.0 lb

## 2022-08-12 DIAGNOSIS — R519 Headache, unspecified: Secondary | ICD-10-CM

## 2022-08-12 DIAGNOSIS — H539 Unspecified visual disturbance: Secondary | ICD-10-CM

## 2022-08-12 DIAGNOSIS — G43709 Chronic migraine without aura, not intractable, without status migrainosus: Secondary | ICD-10-CM

## 2022-08-12 DIAGNOSIS — R51 Headache with orthostatic component, not elsewhere classified: Secondary | ICD-10-CM

## 2022-08-12 DIAGNOSIS — G4484 Primary exertional headache: Secondary | ICD-10-CM

## 2022-08-12 MED ORDER — FREMANEZUMAB-VFRM 225 MG/1.5ML ~~LOC~~ SOSY
675.0000 mg | PREFILLED_SYRINGE | Freq: Once | SUBCUTANEOUS | Status: AC
Start: 2022-08-12 — End: ?

## 2022-08-12 MED ORDER — NURTEC 75 MG PO TBDP: 75.0000 mg | ORAL_TABLET | Freq: Every day | ORAL | 0 refills | Status: AC | PRN

## 2022-08-12 MED ORDER — RIZATRIPTAN BENZOATE 10 MG PO TBDP
10.0000 mg | ORAL_TABLET | ORAL | 11 refills | Status: AC | PRN
Start: 2022-08-12 — End: ?

## 2022-08-12 MED ORDER — ONDANSETRON 4 MG PO TBDP
4.0000 mg | ORAL_TABLET | Freq: Three times a day (TID) | ORAL | 3 refills | Status: DC | PRN
Start: 2022-08-12 — End: 2022-12-10

## 2022-08-12 MED ORDER — TOPIRAMATE 100 MG PO TABS
ORAL_TABLET | ORAL | 11 refills | Status: DC
Start: 2022-08-12 — End: 2022-12-10

## 2022-08-12 NOTE — Patient Instructions (Addendum)
Preventative: Topamax. Bridge with Ajovy samples Acute/emergent: Take  rizatriptan with 4mg  ondansetron. Repeat in 2 hours. If doesn't work change to [place] or nurtec 8 days want you to take nurtec every night samples to bridge In the meantime mychart me with any side effects to Topamax and we can stop and next suggest Ajovy (Emgality) and if feelind better (< 8 migraines a month) prescribe either nurtec or ubrelvy for acute management MRI brain and MRA head  Meds ordered this encounter  Medications   rizatriptan (MAXALT-MLT) 10 MG disintegrating tablet    Sig: Take 1 tablet (10 mg total) by mouth as needed for migraine. May repeat in 2 hours if needed    Dispense:  9 tablet    Refill:  11   topiramate (TOPAMAX) 100 MG tablet    Sig: Start 1/2 a pill at bedtime and then in 1-2 weeks increase to a whole pill at bedtime    Dispense:  30 tablet    Refill:  11   ondansetron (ZOFRAN-ODT) 4 MG disintegrating tablet    Sig: Take 1-2 tablets (4-8 mg total) by mouth every 8 (eight) hours as needed.    Dispense:  30 tablet    Refill:  3   Fremanezumab-vfrm SOSY 675 mg   Rimegepant Sulfate (NURTEC) 75 MG TBDP    Sig: Take 1 tablet (75 mg total) by mouth daily as needed. For migraines. Take as close to onset of migraine as possible. One daily maximum.    Dispense:  8 tablet    Refill:  0    [id number] 07-2024     Fremanezumab Injection What is this medication? FREMANEZUMAB (fre ma NEZ ue mab) prevents migraines. It works by blocking a substance in the body that causes migraines. It is a monoclonal antibody. This medicine may be used for other purposes; ask your health care provider or pharmacist if you have questions. COMMON BRAND NAME(S): AJOVY What should I tell my care team before I take this medication? They need to know if you have any of these conditions: An unusual or allergic reaction to fremanezumab, other medications, foods, dyes, or preservatives Pregnant or trying to get  pregnant Breast-feeding How should I use this medication? This medication is injected under the skin. You will be taught how to prepare and give it. Take it as directed on the prescription label. Keep taking it unless your care team tells you to stop. It is important that you put your used needles and syringes in a special sharps container. Do not put them in a trash can. If you do not have a sharps container, call your pharmacist or care team to get one. Talk to your care team about the use of this medication in children. Special care may be needed. Overdosage: If you think you have taken too much of this medicine contact a poison control center or emergency room at once. NOTE: This medicine is only for you. Do not share this medicine with others. What if I miss a dose? If you miss a dose, take it as soon as you can. If it is almost time for your next dose, take only that dose. Do not take double or extra doses. What may interact with this medication? Interactions are not expected. This list may not describe all possible interactions. Give your health care provider a list of all the medicines, herbs, non-prescription drugs, or dietary supplements you use. Also tell them if you smoke, drink alcohol, or use illegal drugs. Some items  may interact with your medicine. What should I watch for while using this medication? Tell your care team if your symptoms do not start to get better or if they get worse. What side effects may I notice from receiving this medication? Side effects that you should report to your care team as soon as possible: Allergic reactions or angioedema--skin rash, itching or hives, swelling of the face, eyes, lips, tongue, arms, or legs, trouble swallowing or breathing Side effects that usually do not require medical attention (report to your care team if they continue or are bothersome): Pain, redness, or irritation at injection site This list may not describe all possible side  effects. Call your doctor for medical advice about side effects. You may report side effects to FDA at 1-800-FDA-1088. Where should I keep my medication? Keep out of the reach of children and pets. Store in a refrigerator or at room temperature between 20 and 25 degrees C (68 and 77 degrees F). Refrigeration (preferred): Store in the refrigerator. Do not freeze. Keep in the original container until you are ready to take it. Remove the dose from the carton about 30 minutes before it is time for you to use it. If the dose is not used, it may be stored in the original container at room temperature for 7 days. Get rid of any unused medication after the expiration date. Room Temperature: This medication may be stored at room temperature for up to 7 days. Keep it in the original container. Protect from light until time of use. If it is stored at room temperature, get rid of any unused medication after 7 days or after it expires, whichever is first. To get rid of medications that are no longer needed or have expired: Take the medication to a medication take-back program. Check with your pharmacy or law enforcement to find a location. If you cannot return the medication, ask your pharmacist or care team how to get rid of this medication safely. NOTE: This sheet is a summary. It may not cover all possible information. If you have questions about this medicine, talk to your doctor, pharmacist, or health care provider.  2024 Elsevier/Gold Standard (2021-04-13 00:00:00) Rimegepant Disintegrating Tablets What is this medication? RIMEGEPANT (ri ME je pant) prevents and treats migraines. It works by blocking a substance in the body that causes migraines. This medicine may be used for other purposes; ask your health care provider or pharmacist if you have questions. COMMON BRAND NAME(S): NURTEC ODT What should I tell my care team before I take this medication? They need to know if you have any of these  conditions: Kidney disease Liver disease An unusual or allergic reaction to rimegepant, other medications, foods, dyes, or preservatives Pregnant or trying to get pregnant Breast-feeding How should I use this medication? Take this medication by mouth. Take it as directed on the prescription label. Leave the tablet in the sealed pack until you are ready to take it. With dry hands, open the pack and gently remove the tablet. If the tablet breaks or crumbles, throw it away. Use a new tablet. Place the tablet in the mouth and allow it to dissolve. Then, swallow it. Do not cut, crush, or chew this medication. You do not need water to take this medication. Talk to your care team about the use of this medication in children. Special care may be needed. Overdosage: If you think you have taken too much of this medicine contact a poison control center or emergency room  at once. NOTE: This medicine is only for you. Do not share this medicine with others. What if I miss a dose? This does not apply. This medication is not for regular use. What may interact with this medication? Certain medications for fungal infections, such as fluconazole, itraconazole Rifampin This list may not describe all possible interactions. Give your health care provider a list of all the medicines, herbs, non-prescription drugs, or dietary supplements you use. Also tell them if you smoke, drink alcohol, or use illegal drugs. Some items may interact with your medicine. What should I watch for while using this medication? Visit your care team for regular checks on your progress. Tell your care team if your symptoms do not start to get better or if they get worse. What side effects may I notice from receiving this medication? Side effects that you should report to your care team as soon as possible: Allergic reactions--skin rash, itching, hives, swelling of the face, lips, tongue, or throat Side effects that usually do not require  medical attention (report to your care team if they continue or are bothersome): Nausea Stomach pain This list may not describe all possible side effects. Call your doctor for medical advice about side effects. You may report side effects to FDA at 1-800-FDA-1088. Where should I keep my medication? Keep out of the reach of children and pets. Store at room temperature between 20 and 25 degrees C (68 and 77 degrees F). Get rid of any unused medication after the expiration date. To get rid of medications that are no longer needed or have expired: Take the medication to a medication take-back program. Check with your pharmacy or law enforcement to find a location. If you cannot return the medication, check the label or package insert to see if the medication should be thrown out in the garbage or flushed down the toilet. If you are not sure, ask your care team. If it is safe to put it in the trash, take the medication out of the container. Mix the medication with cat litter, dirt, coffee grounds, or other unwanted substance. Seal the mixture in a bag or container. Put it in the trash. NOTE: This sheet is a summary. It may not cover all possible information. If you have questions about this medicine, talk to your doctor, pharmacist, or health care provider.  2024 Elsevier/Gold Standard (2021-04-11 00:00:00) Ondansetron Dissolving Tablets What is this medication? ONDANSETRON (on DAN se tron) prevents nausea and vomiting from chemotherapy, radiation, or surgery. It works by blocking substances in the body that may cause nausea or vomiting. It belongs to a group of medications called antiemetics. This medicine may be used for other purposes; ask your health care provider or pharmacist if you have questions. COMMON BRAND NAME(S): Zofran ODT What should I tell my care team before I take this medication? They need to know if you have any of these conditions: Heart disease Irregular heartbeat or  rhythm Liver disease Low levels of magnesium or potassium in the blood An unusual or allergic reaction to ondansetron, other medications, foods, dyes, or preservatives Pregnant or trying to get pregnant Breastfeeding How should I use this medication? Take this medication by mouth. Take it as directed on the prescription label at the same time every day. You do not need water to take this medication. Leave the tablet in the sealed pack until you are ready to take it. With dry hands, open the pack and gently remove the tablet. Place the tablet in  the mouth and allow it to dissolve. Then, swallow it. Talk to your care team about the use of this medication in children. Special care may be needed. Overdosage: If you think you have taken too much of this medicine contact a poison control center or emergency room at once. NOTE: This medicine is only for you. Do not share this medicine with others. What if I miss a dose? If you miss a dose, take it as soon as you can. If it is almost time for your next dose, take only that dose. Do not take double or extra doses. What may interact with this medication? Do not take this medication with any of the following: Apomorphine Certain medications for fungal infections, such as fluconazole, ketoconazole, posaconazole Cisapride Dronedarone Levoketoconazole Pimozide Quinidine Thioridazine This medication may also interact with the following: Certain medications for depression, anxiety, or other mental health conditions Certain medications for migraines, such as sumatriptan Linezolid Methylene blue Opioids Other medications that cause heart rhythm changes, such as dofetilide or ziprasidone St. John's wort Stimulant medications for ADHD, weight loss, or staying awake Tryptophan This list may not describe all possible interactions. Give your health care provider a list of all the medicines, herbs, non-prescription drugs, or dietary supplements you use.  Also tell them if you smoke, drink alcohol, or use illegal drugs. Some items may interact with your medicine. What should I watch for while using this medication? Check with your care team as soon as you can if you have any sign of an allergic reaction. What side effects may I notice from receiving this medication? Side effects that you should report to your care team as soon as possible: Allergic reactions--skin rash, itching, hives, swelling of the face, lips, tongue, or throat Bowel blockage--stomach cramping, unable to have a bowel movement or pass gas, loss of appetite, vomiting Chest pain (angina)--pain, pressure, or tightness in the chest, neck, back, or arms Heart rhythm changes--fast or irregular heartbeat, dizziness, feeling faint or lightheaded, chest pain, trouble breathing Irritability, confusion, fast or irregular heartbeat, muscle stiffness, twitching muscles, sweating, high fever, seizure, chills, vomiting, diarrhea, which may be signs of serotonin syndrome Side effects that usually do not require medical attention (report to your care team if they continue or are bothersome): Constipation Diarrhea General discomfort and fatigue Headache This list may not describe all possible side effects. Call your doctor for medical advice about side effects. You may report side effects to FDA at 1-800-FDA-1088. Where should I keep my medication? Keep out of the reach of children and pets. Store between 2 and 30 degrees C (36 and 86 degrees F). Throw away any unused medication after the expiration date. NOTE: This sheet is a summary. It may not cover all possible information. If you have questions about this medicine, talk to your doctor, pharmacist, or health care provider.  2024 Elsevier/Gold Standard (2022-04-24 00:00:00) Topiramate Tablets What is this medication? TOPIRAMATE (toe PYRE a mate) prevents and controls seizures in people with epilepsy. It may also be used to prevent migraine  headaches. It works by calming overactive nerves in your body. This medicine may be used for other purposes; ask your health care provider or pharmacist if you have questions. COMMON BRAND NAME(S): Topamax, Topiragen What should I tell my care team before I take this medication? They need to know if you have any of these conditions: Bleeding disorder Kidney disease Lung disease Suicidal thoughts, plans, or attempt by you or a family member An unusual or allergic  reaction to topiramate, other medications, foods, dyes, or preservatives Pregnant or trying to get pregnant Breast-feeding How should I use this medication? Take this medication by mouth with water. Take it as directed on the prescription label at the same time every day. Do not cut, crush or chew this medicine. Swallow the tablets whole. You can take it with or without food. If it upsets your stomach, take it with food. Keep taking it unless your care team tells you to stop. A special MedGuide will be given to you by the pharmacist with each prescription and refill. Be sure to read this information carefully each time. Talk to your care team about the use of this medication in children. While it may be prescribed for children as young as 2 years for selected conditions, precautions do apply. Overdosage: If you think you have taken too much of this medicine contact a poison control center or emergency room at once. NOTE: This medicine is only for you. Do not share this medicine with others. What if I miss a dose? If you miss a dose, take it as soon as you can unless it is within 6 hours of the next dose. If it is within 6 hours of the next dose, skip the missed dose. Take the next dose at the normal time. Do not take double or extra doses. What may interact with this medication? Acetazolamide Alcohol Antihistamines for allergy, cough, and cold Aspirin and aspirin-like medications Atropine Certain medications for anxiety or  sleep Certain medications for bladder problems, such as oxybutynin, tolterodine Certain medications for depression, such as amitriptyline, fluoxetine, sertraline Certain medications for Parkinson disease, such as benztropine, trihexyphenidyl Certain medications for seizures, such as carbamazepine, lamotrigine, phenobarbital, phenytoin, primidone, valproic acid, zonisamide Certain medications for stomach problems, such as dicyclomine, hyoscyamine Certain medications for travel sickness, such as scopolamine Certain medications that treat or prevent blood clots, such as warfarin, enoxaparin, dalteparin, apixaban, dabigatran, rivaroxaban Digoxin Diltiazem Estrogen and progestin hormones General anesthetics, such as halothane, isoflurane, methoxyflurane, propofol Glyburide Hydrochlorothiazide Ipratropium Lithium Medications that relax muscles Metformin NSAIDs, medications for pain and inflammation, such as ibuprofen or naproxen Opioid medications for pain Phenothiazines, such as chlorpromazine, mesoridazine, prochlorperazine, thioridazine Pioglitazone This list may not describe all possible interactions. Give your health care provider a list of all the medicines, herbs, non-prescription drugs, or dietary supplements you use. Also tell them if you smoke, drink alcohol, or use illegal drugs. Some items may interact with your medicine. What should I watch for while using this medication? Visit your care team for regular checks on your progress. Tell your care team if your symptoms do not start to get better or if they get worse. Do not suddenly stop taking this medication. You may develop a severe reaction. Your care team will tell you how much medication to take. If your care team wants you to stop the medication, the dose may be slowly lowered over time to avoid any side effects. Wear a medical ID bracelet or chain. Carry a card that describes your condition. List the medications and doses you  take on the card. This medication may affect your coordination, reaction time, or judgment. Do not drive or operate machinery until you know how this medication affects you. Sit up or stand slowly to reduce the risk of dizzy or fainting spells. Drinking alcohol with this medication can increase the risk of these side effects. This medication may cause serious skin reactions. They can happen weeks to months after starting the  medication. Contact your care team right away if you notice fevers or flu-like symptoms with a rash. The rash may be red or purple and then turn into blisters or peeling of the skin. You may also notice a red rash with swelling of the face, lips, or lymph nodes in your neck or under your arms. This medication may cause thoughts of suicide or depression. This includes sudden changes in mood, behaviors, or thoughts. These changes can happen at any time but are more common in the beginning of treatment or after a change in dose. Call your care team right away if you experience these thoughts or worsening depression. This medication may slow your child's growth if it is taken for a long time at high doses. Your child's care team will monitor your child's growth. Using this medication for a long time may weaken your bones. The risk of bone fractures may be increased. Talk to your care team about your bone health. Discuss this medication with your care team if you may be pregnant. Serious birth defects can occur if you take this medication during pregnancy. There are benefits and risks to taking medications during pregnancy. Your care team can help you find the option that works for you. Contraception is recommended while taking this medication. Estrogen and progestin hormones may not work as well while you are taking this medication. Your care team can help you find the option that works for you. Talk to your care team before breastfeeding. Changes to your treatment plan may be needed. What  side effects may I notice from receiving this medication? Side effects that you should report to your care team as soon as possible: Allergic reactions--skin rash, itching, hives, swelling of the face, lips, tongue, or throat High acid level--trouble breathing, unusual weakness or fatigue, confusion, headache, fast or irregular heartbeat, nausea, vomiting High ammonia level--unusual weakness or fatigue, confusion, loss of appetite, nausea, vomiting, seizures Fever that does not go away, decrease in sweat Kidney stones--blood in the urine, pain or trouble passing urine, pain in the lower back or sides Redness, blistering, peeling or loosening of the skin, including inside the mouth Sudden eye pain or change in vision such as blurry vision, seeing halos around lights, vision loss Thoughts of suicide or self-harm, worsening mood, feelings of depression Side effects that usually do not require medical attention (report to your care team if they continue or are bothersome): Burning or tingling sensation in hands or feet Difficulty with paying attention, memory, or speech Dizziness Drowsiness Fatigue Loss of appetite with weight loss Slow or sluggish movements of the body This list may not describe all possible side effects. Call your doctor for medical advice about side effects. You may report side effects to FDA at 1-800-FDA-1088. Where should I keep my medication? Keep out of the reach of children and pets. Store between 15 and 30 degrees C (59 and 86 degrees F). Protect from moisture. Keep the container tightly closed. Get rid of any unused medication after the expiration date. To get rid of medications that are no longer needed or have expired: Take the medication to a medication take-back program. Check with your pharmacy or law enforcement to find a location. If you cannot return the medication, check the label or package insert to see if the medication should be thrown out in the garbage or  flushed down the toilet. If you are not sure, ask your care team. If it is safe to put it in the trash, empty  the medication out of the container. Mix the medication with cat litter, dirt, coffee grounds, or other unwanted substance. Seal the mixture in a bag or container. Put it in the trash. NOTE: This sheet is a summary. It may not cover all possible information. If you have questions about this medicine, talk to your doctor, pharmacist, or health care provider.  2024 Elsevier/Gold Standard (2021-07-12 00:00:00)

## 2022-08-12 NOTE — Telephone Encounter (Signed)
No PA Needed

## 2022-08-12 NOTE — Telephone Encounter (Signed)
Pharmacy Patient Advocate Encounter   Received notification from [facility] that prior authorization for Rizatriptan 10MG  ODT Tablets is required/requested.   PA submitted to  [employer]  via [email or web address] Key or (Medicaid) confirmation # PA (EOC) ID: [id number] Status is pending    PA (EOC) ID: [id number]

## 2022-08-12 NOTE — Progress Notes (Signed)
[facility]    Provider:  Dr Lucia Gaskins Requesting Provider: Orlene Plum, NP Primary Care Provider:  Tylene Fantasia., PA-C  CC:  migraines  HPI:  Richard Clarke is a 37 y.o. male here as requested by Orlene Plum, NP for migraines. PMHx has Abdominal pain, chronic, epigastric and GERD (gastroesophageal reflux disease) on their problem list. Also from a review of notes ates that patient has depression, asthma, gerd.  I reviewed Dr. Daphine Deutscher as his records, migraines noted as well, some symptoms sound like trigeminal neuralgia, described as sudden sharp shooting pain in the face, brother has a seizure disorder, send neurology years ago before he lost insurance would like to follow-up again.  Struggling with anxiety and depression for the last several months has been on medication in the past but not recently.  He was also recently in the emergency room for recurrent migraine, I reviewed notes, Dr. Bryn Gulling, he presented complaining of current generalized headache with photophobia and nausea and occasional vomiting longstanding and expressed concern that he might have an aneurysm in his head, CT scan of the head was negative, he has otherwise been well without fever chills malaise or signs and symptoms of an acute infectious illness.  Physical exam was nonfocal.  Migraine cocktail with Benadryl and Reglan subsided his headache.  He also had a recent surgery by Dr. Brynda Peon and ENT for 1024 I reviewed notes for tonsil stones.  Had migraines since the 6th grade, he threw up, he has never seen neurology, he has had them in his sleep and wake up with such a bad migraine he had to go to the ED. He started on propranolol, reglan not helping. Sumatriptan did not help. It is around the temples both sides, rubbing the temples help, eyes get blurry, nausea, vomiting, photophobia/phonophobia, sleep helps a dark room helps. Unknwon triggers, they are severe, movement makes it  worse getting up makes it pulsae more, pulsating/pouding/throbbing. No medication overuse. He has 21 headaches days a month abd at least 15 migraine days a month that lasy up to 3 days and send him to the ED. Exertional especilly if he is working out. No other focal neurologic deficits, associated symptoms, inciting events or modifiable factors.   Reviewed notes, labs and imaging from outside physicians, which showed:   I reviewed "care everywhere" and epic, the last labs I have are from October 15, 2021 with a normal TSH, normal CRP, and from February 2023 with a normal BMP and an unremarkable CBC with BUN 19 and creatinine 0.75  Medications tried that can be used in headache and migraine management, from a thorough review of records include: Flexeril, sertraline, cannot take amitriptyline or nortriptyline due to being on sertraline and risk of serotonin syndrome, cannot take propranolol or beta-blockers due to asthma.tylenol, ibuprofen, naproxen, imitrex, zofran, reglan, propranolol, reglan  CT head 05/31/2022: CT HEAD WITHOUT CONTRAST   TECHNIQUE:  Contiguous axial images were obtained from the base of the skull  through the vertex without intravenous contrast.   RADIATION DOSE REDUCTION: This exam was performed according to the  departmental dose-optimization program which includes automated  exposure control, adjustment of the mA and/or kV according to  patient size and/or use of iterative reconstruction technique.   COMPARISON:  None Available.   FINDINGS:  Brain: No evidence of acute infarction, hemorrhage, hydrocephalus,  extra-axial collection or mass lesion/mass effect.   Vascular: No hyperdense vessel or unexpected calcification.   Skull: Normal. Negative for fracture  or focal lesion.   Sinuses/Orbits: No acute finding.   Other: None.   Review of Systems: Patient complains of symptoms per HPI as well as the following symptoms migraines. Pertinent negatives and positives  per HPI. All others negative.   Social History   Socioeconomic History   Marital status: Single    Spouse name: Not on file   Number of children: Not on file   Years of education: Not on file   Highest education level: Not on file  Occupational History   Not on file  Tobacco Use   Smoking status: Never    Passive exposure: Never   Smokeless tobacco: Never  Vaping Use   Vaping Use: Never used  Substance and Sexual Activity   Alcohol use: Yes    Comment: occ beer   Drug use: Not Currently   Sexual activity: Yes  Other Topics Concern   Not on file  Social History Narrative   Not on file   Social Determinants of Health   Financial Resource Strain: Not on file  Food Insecurity: Not on file  Transportation Needs: Not on file  Physical Activity: Not on file  Stress: Not on file  Social Connections: Not on file  Intimate Partner Violence: Not on file    Family History  Problem Relation Age of Onset   Throat cancer Father    Colon cancer Maternal Aunt        passed away at age 52    Colon polyps Neg Hx    Migraines Neg Hx     Past Medical History:  Diagnosis Date   Asthma    GERD (gastroesophageal reflux disease)    Migraines    Tonsillar calculus     Patient Active Problem List   Diagnosis Date Noted   Abdominal pain, chronic, epigastric 03/12/2017   GERD (gastroesophageal reflux disease) 03/12/2017    Past Surgical History:  Procedure Laterality Date   BIOPSY  10/15/2021   Procedure: BIOPSY;  Surgeon: Lanelle Bal, DO;  [facility];  Service: Endoscopy;;   COLONOSCOPY WITH PROPOFOL N/A 10/15/2021   Procedure: COLONOSCOPY WITH PROPOFOL;  Surgeon: Lanelle Bal, DO;  [facility];  Service: Endoscopy;  Laterality: N/A;  1245   ESOPHAGOGASTRODUODENOSCOPY (EGD) WITH PROPOFOL N/A 10/15/2021   Procedure: ESOPHAGOGASTRODUODENOSCOPY (EGD) WITH PROPOFOL;  Surgeon: Lanelle Bal, DO;  [facility];  Service: Endoscopy;   Laterality: N/A;   None     POLYPECTOMY  10/15/2021   Procedure: POLYPECTOMY;  Surgeon: Lanelle Bal, DO;  [facility];  Service: Endoscopy;;   TONSILLECTOMY Bilateral 06/17/2022   Procedure: TONSILLECTOMY;  Surgeon: [name], MD;  Location: [facility];  Service: ENT;  Laterality: Bilateral;    Current Outpatient Medications  Medication Sig Dispense Refill   acetaminophen (TYLENOL) 500 MG tablet Take 1,000 mg by mouth every 6 (six) hours as needed for moderate pain.     albuterol (PROVENTIL HFA;VENTOLIN HFA) 108 (90 BASE) MCG/ACT inhaler Inhale 2 puffs into the lungs every 6 (six) hours as needed for wheezing.     cetirizine (ZYRTEC) 10 MG tablet Take 10 mg by mouth daily as needed for allergies.     desonide (DESOWEN) 0.05 % lotion Apply 1 Application topically daily as needed for rash.     fluticasone (FLONASE) 50 MCG/ACT nasal spray Place 2 sprays into both nostrils 2 (two) times daily.     HYDROcodone-acetaminophen (HYCET) 7.5-325 mg/15 ml solution Take  15 mLs by mouth every 6 (six) hours as needed for moderate pain. 420 mL 0   ibuprofen (ADVIL) 800 MG tablet Take 1 tablet (800 mg total) by mouth every 6 (six) hours as needed for moderate pain. 20 tablet 0   Multiple Vitamins-Minerals (ADULT GUMMY PO) Take 2 capsules by mouth daily.     naproxen (NAPROSYN) 500 MG tablet Take 1 tablet (500 mg total) by mouth 2 (two) times daily with a meal. 30 tablet 0   ondansetron (ZOFRAN-ODT) 4 MG disintegrating tablet Take 1 tablet (4 mg total) by mouth every 8 (eight) hours as needed for nausea. 10 tablet 0   ondansetron (ZOFRAN-ODT) 4 MG disintegrating tablet Take 1 tablet (4 mg total) by mouth every 8 (eight) hours as needed for nausea or vomiting. 20 tablet 0   ondansetron (ZOFRAN-ODT) 4 MG disintegrating tablet Take 1-2 tablets (4-8 mg total) by mouth every 8 (eight) hours as needed. 30 tablet 3   pantoprazole (PROTONIX) 40 MG tablet Take 1 tablet (40 mg total) by  mouth 2 (two) times daily before a meal. 60 tablet 11   Rimegepant Sulfate (NURTEC) 75 MG TBDP Take 1 tablet (75 mg total) by mouth daily as needed. For migraines. Take as close to onset of migraine as possible. One daily maximum. 8 tablet 0   rizatriptan (MAXALT-MLT) 10 MG disintegrating tablet Take 1 tablet (10 mg total) by mouth as needed for migraine. May repeat in 2 hours if needed 9 tablet 11   topiramate (TOPAMAX) 100 MG tablet Start 1/2 a pill at bedtime and then in 1-2 weeks increase to a whole pill at bedtime 30 tablet 11   Current Facility-Administered Medications  Medication Dose Route Frequency Provider Last Rate Last Admin   Fremanezumab-vfrm SOSY 675 mg  675 mg Subcutaneous Once Anson Fret, MD        Allergies as of 08/12/2022 - Review Complete 08/12/2022  Allergen Reaction Noted   Levaquin [levofloxacin] Rash 08/08/2015    Vitals: BP 116/61   Pulse (!) 52  Last Weight:  Wt Readings from Last 1 Encounters:  06/17/22 136 lb 11 oz (62 kg)   Last Height:   Ht Readings from Last 1 Encounters:  06/17/22 5\' 9"  (1.753 m)     Physical exam: Exam: Gen: NAD, conversant, well nourised,  well groomed                     CV: RRR, no MRG. No Carotid Bruits. No peripheral edema, warm, nontender Eyes: Conjunctivae clear without exudates or hemorrhage  Neuro: Detailed Neurologic Exam  Speech:    Speech is normal; fluent and spontaneous with normal comprehension.  Cognition:    The patient is oriented to person, place, and time;     recent and remote memory intact;     language fluent;     normal attention, concentration,     fund of knowledge Cranial Nerves:    The pupils are equal, round, and reactive to light. The fundi are normal and spontaneous venous pulsations are present. Visual fields are full to finger confrontation. Extraocular movements are intact. Trigeminal sensation is intact and the muscles of mastication are normal. The face is symmetric. The palate  elevates in the midline. Hearing intact. Voice is normal. Shoulder shrug is normal. The tongue has normal motion without fasciculations.   Coordination:    Normal finger to nose and heel to shin. Normal rapid alternating movements.   Gait:    Heel-toe  and tandem gait are normal.   Motor Observation:    No asymmetry, no atrophy, and no involuntary movements noted. Tone:    Normal muscle tone.    Posture:    Posture is normal. normal erect    Strength:    Strength is V/V in the upper and lower limbs.      Sensation: intact to LT     Reflex Exam:  DTR's:    Deep tendon reflexes in the upper and lower extremities are normal bilaterally.   Toes:    The toes are downgoing bilaterally.   Clonus:    Clonus is absent.    Assessment/Plan:  Patient with chronic migraines  Preventative: Topamax. Bridge with Ajovy samples. If side effects to topamax order ajovy.  Acute/emergent: Take  rizatriptan with 4mg  ondansetron. Repeat in 2 hours. If doesn't work change to [place] or nurtec 8 days want you to take nurtec every night samples to bridge In the meantime mychart me with any side effects to Topamax and we can stop and next suggest Ajovy (Emgality) and if feelind better (< 8 migraines a month) prescribe either nurtec or ubrelvy for acute management MRI brain and MRA head: MRI brain due to concerning symptoms of morning headaches, positional and exertional headaches,vision changes, worsening headaches  to look for space occupying mass, chiari or intracranial hypertension (pseudotumor), strokes, malignancies, vasculidities, demyelination(multiple sclerosis) or other. Also exertional headaches ti look for aneurysms   Orders Placed This Encounter  Procedures   MR BRAIN W WO CONTRAST   MR ANGIO HEAD WO CONTRAST   CBC with Differential/Platelets   Comprehensive metabolic panel   TSH Rfx on Abnormal to Free T4   Meds ordered this encounter  Medications   rizatriptan (MAXALT-MLT) 10 MG  disintegrating tablet    Sig: Take 1 tablet (10 mg total) by mouth as needed for migraine. May repeat in 2 hours if needed    Dispense:  9 tablet    Refill:  11   topiramate (TOPAMAX) 100 MG tablet    Sig: Start 1/2 a pill at bedtime and then in 1-2 weeks increase to a whole pill at bedtime    Dispense:  30 tablet    Refill:  11   ondansetron (ZOFRAN-ODT) 4 MG disintegrating tablet    Sig: Take 1-2 tablets (4-8 mg total) by mouth every 8 (eight) hours as needed.    Dispense:  30 tablet    Refill:  3   Fremanezumab-vfrm SOSY 675 mg   Rimegepant Sulfate (NURTEC) 75 MG TBDP    Sig: Take 1 tablet (75 mg total) by mouth daily as needed. For migraines. Take as close to onset of migraine as possible. One daily maximum.    Dispense:  8 tablet    Refill:  0    [id number] 07-2024    Cc: Orlene Plum, NP,  Muse, Blanket D., PA-C  Naomie Dean, MD  [facility] [address] [place], [place] [address]  Phone [phone] Fax [phone]

## 2022-08-13 LAB — COMPREHENSIVE METABOLIC PANEL
ALT: 31 IU/L (ref 0–44)
AST: 27 IU/L (ref 0–40)
Albumin/Globulin Ratio: 1.9
Albumin: 4.5 g/dL (ref 4.1–5.1)
Alkaline Phosphatase: 61 IU/L (ref 44–121)
BUN/Creatinine Ratio: 23 — ABNORMAL HIGH (ref 9–20)
BUN: 19 mg/dL (ref 6–20)
Bilirubin Total: 0.4 mg/dL (ref 0.0–1.2)
CO2: 23 mmol/L (ref 20–29)
Calcium: 9.6 mg/dL (ref 8.7–10.2)
Chloride: 100 mmol/L (ref 96–106)
Creatinine, Ser: 0.84 mg/dL (ref 0.76–1.27)
Globulin, Total: 2.4 g/dL (ref 1.5–4.5)
Glucose: 88 mg/dL (ref 70–99)
Potassium: 4.3 mmol/L (ref 3.5–5.2)
Sodium: 139 mmol/L (ref 134–144)
Total Protein: 6.9 g/dL (ref 6.0–8.5)
eGFR: 116 mL/min/{1.73_m2} (ref >59)

## 2022-08-13 LAB — CBC WITH DIFFERENTIAL/PLATELET
Basophils Absolute: 0.1 10*3/uL (ref 0.0–0.2)
Basos: 1 %
EOS (ABSOLUTE): 0.2 10*3/uL (ref 0.0–0.4)
Eos: 3 %
Hematocrit: 45.5 % (ref 37.5–51.0)
Hemoglobin: 14.7 g/dL (ref 13.0–17.7)
Immature Grans (Abs): 0 10*3/uL (ref 0.0–0.1)
Immature Granulocytes: 0 %
Lymphocytes Absolute: 1.8 10*3/uL (ref 0.7–3.1)
Lymphs: 36 %
MCH: 26.4 pg — ABNORMAL LOW (ref 26.6–33.0)
MCHC: 32.3 g/dL (ref 31.5–35.7)
MCV: 82 fL (ref 79–97)
Monocytes Absolute: 0.4 10*3/uL (ref 0.1–0.9)
Monocytes: 9 %
Neutrophils Absolute: 2.5 10*3/uL (ref 1.4–7.0)
Neutrophils: 51 %
Platelets: 320 10*3/uL (ref 150–450)
RBC: 5.57 x10E6/uL (ref 4.14–5.80)
RDW: 14.3 % (ref 11.6–15.4)
WBC: 5 10*3/uL (ref 3.4–10.8)

## 2022-08-13 LAB — TSH RFX ON ABNORMAL TO FREE T4: TSH: 1.85 u[IU]/mL (ref 0.450–4.500)

## 2022-12-07 ENCOUNTER — Other Ambulatory Visit: Payer: Self-pay | Admitting: Internal Medicine

## 2022-12-09 ENCOUNTER — Other Ambulatory Visit: Payer: Self-pay

## 2022-12-09 MED ORDER — PANTOPRAZOLE SODIUM 40 MG PO TBEC: 40.0000 mg | DELAYED_RELEASE_TABLET | Freq: Two times a day (BID) | ORAL | 11 refills | Status: DC

## 2022-12-10 ENCOUNTER — Ambulatory Visit (INDEPENDENT_AMBULATORY_CARE_PROVIDER_SITE_OTHER): Payer: Commercial Managed Care - PPO | Admitting: Gastroenterology

## 2022-12-10 ENCOUNTER — Telehealth: Payer: Self-pay | Admitting: *Deleted

## 2022-12-10 ENCOUNTER — Encounter: Payer: Self-pay | Admitting: Gastroenterology

## 2022-12-10 VITALS — BP 127/76 | HR 60 | Temp 97.2°F | Ht 69.0 in | Wt 147.8 lb

## 2022-12-10 DIAGNOSIS — K219 Gastro-esophageal reflux disease without esophagitis: Secondary | ICD-10-CM | POA: Diagnosis not present

## 2022-12-10 DIAGNOSIS — R1032 Left lower quadrant pain: Secondary | ICD-10-CM | POA: Diagnosis not present

## 2022-12-10 DIAGNOSIS — R140 Abdominal distension (gaseous): Secondary | ICD-10-CM

## 2022-12-10 MED ORDER — PANTOPRAZOLE SODIUM 40 MG PO TBEC: 40.0000 mg | DELAYED_RELEASE_TABLET | Freq: Two times a day (BID) | ORAL | 3 refills | Status: AC

## 2022-12-10 NOTE — Progress Notes (Signed)
Gastroenterology Office Note     Primary Care Physician:  Tylene Fantasia., PA-C  Primary Gastroenterologist: Dr. Marletta Lor    Chief Complaint   Chief Complaint  Patient presents with   Follow-up    Follow up on abd pain LLQ pain (for years)     History of Present Illness   Richard Clarke is a 37 y.o. male presenting today with a history of chronic abdominal pain present for over 6-7, initially seen in clinic in 2019 and last seen in 2023.   Chronic LLQ abdominal pain. Prior evaluation last year with colonoscopy/EGD, negative CRP, negative celiac serologies.   Feels like pain has worsened over the years. Some days not as bad as others. Most days 8 out of 9 on a regular basis. Always has gas. Stopped drinking alcohol for a year and still had the symptoms. No improvement after BM. BM usually every 3 days. Used to drink coffee and sodas but can't drink this now as causes significant GERD. Any bumpy movement causes pain to worsen. Changed lifestyle and working out but still living with the pain. Pain worsens if arm is on stomach. Pain is calmest when laying down. No bulging or hernia type areas.   Sometimes has to force burps to come up. Pantoprazole BID. States over the summer had migraines and was taking tylenol and told his liver numbers up. June 2024 labs resolved.   PPI BID controls symptoms. If misses any doses, he will feel very bad.    Goes to work from 12-8. Picky about bathrooms.    Colonoscopy Aug 2023: non-bleeding internal hemorrhoids, one 4 mm polyp in descending colon. Hyperplastic.  EGD Aug 2023:gastritis, normal duodenum. Negative H.pylori.    Past Medical History:  Diagnosis Date   Asthma    GERD (gastroesophageal reflux disease)    Migraines    Tonsillar calculus     Past Surgical History:  Procedure Laterality Date   BIOPSY  10/15/2021   Procedure: BIOPSY;  Surgeon: Lanelle Bal, DO;  [facility];  Service: Endoscopy;;    COLONOSCOPY WITH PROPOFOL N/A 10/15/2021   Procedure: COLONOSCOPY WITH PROPOFOL;  Surgeon: Lanelle Bal, DO;  [facility];  Service: Endoscopy;  Laterality: N/A;  1245   ESOPHAGOGASTRODUODENOSCOPY (EGD) WITH PROPOFOL N/A 10/15/2021   Procedure: ESOPHAGOGASTRODUODENOSCOPY (EGD) WITH PROPOFOL;  Surgeon: Lanelle Bal, DO;  [facility];  Service: Endoscopy;  Laterality: N/A;   None     POLYPECTOMY  10/15/2021   Procedure: POLYPECTOMY;  Surgeon: Lanelle Bal, DO;  [facility];  Service: Endoscopy;;   TONSILLECTOMY Bilateral 06/17/2022   Procedure: TONSILLECTOMY;  Surgeon: [name], MD;  Location: [facility];  Service: ENT;  Laterality: Bilateral;    Current Outpatient Medications  Medication Sig Dispense Refill   acetaminophen (TYLENOL) 500 MG tablet Take 1,000 mg by mouth every 6 (six) hours as needed for moderate pain.     albuterol (PROVENTIL HFA;VENTOLIN HFA) 108 (90 BASE) MCG/ACT inhaler Inhale 2 puffs into the lungs every 6 (six) hours as needed for wheezing.     cetirizine (ZYRTEC) 10 MG tablet Take 10 mg by mouth daily as needed for allergies.     ibuprofen (ADVIL) 800 MG tablet Take 1 tablet (800 mg total) by mouth every 6 (six) hours as needed for moderate pain. 20 tablet 0   pantoprazole (PROTONIX) 40 MG tablet Take 1 tablet (40 mg total) by mouth 2 (two) times  daily before a meal. 180 tablet 3   Rimegepant Sulfate (NURTEC) 75 MG TBDP Take 1 tablet (75 mg total) by mouth daily as needed. For migraines. Take as close to onset of migraine as possible. One daily maximum. 8 tablet 0   rizatriptan (MAXALT-MLT) 10 MG disintegrating tablet Take 1 tablet (10 mg total) by mouth as needed for migraine. May repeat in 2 hours if needed 9 tablet 11   sertraline (ZOLOFT) 25 MG tablet Take 25 mg by mouth daily.     Current Facility-Administered Medications  Medication Dose Route Frequency Provider Last Rate Last Admin    Fremanezumab-vfrm SOSY 675 mg  675 mg Subcutaneous Once Anson Fret, MD        Allergies as of 12/10/2022 - Review Complete 12/10/2022  Allergen Reaction Noted   Levaquin [levofloxacin] Rash 08/08/2015    Family History  Problem Relation Age of Onset   Throat cancer Father    Colon cancer Maternal Aunt        passed away at age 39    Colon polyps Neg Hx    Migraines Neg Hx     Social History   Socioeconomic History   Marital status: Single    Spouse name: Not on file   Number of children: Not on file   Years of education: Not on file   Highest education level: Not on file  Occupational History   Not on file  Tobacco Use   Smoking status: Never    Passive exposure: Never   Smokeless tobacco: Never  Vaping Use   Vaping status: Never Used  Substance and Sexual Activity   Alcohol use: Yes    Comment: occ beer   Drug use: Not Currently   Sexual activity: Yes  Other Topics Concern   Not on file  Social History Narrative   Not on file   Social Determinants of Health   Financial Resource Strain: Not on file  Food Insecurity: Low Risk  (05/21/2022)   [facility]   Hunger Vital Sign    Worried About Running Out of Food in the Last Year: Never true    Ran Out of Food in the Last Year: Never true  Transportation Needs: Not on file (05/21/2022)  Physical Activity: Not on file  Stress: Not on file  Social Connections: Not on file  Intimate Partner Violence: Not on file     Review of Systems   Gen: Denies any fever, chills, fatigue, weight loss, lack of appetite.  CV: Denies chest pain, heart palpitations, peripheral edema, syncope.  Resp: Denies shortness of breath at rest or with exertion. Denies wheezing or cough.  GI: Denies dysphagia or odynophagia. Denies jaundice, hematemesis, fecal incontinence. GU : Denies urinary burning, urinary frequency, urinary hesitancy MS: Denies joint pain, muscle weakness, cramps, or limitation of  movement.  Derm: Denies rash, itching, dry skin Psych: Denies depression, anxiety, memory loss, and confusion Heme: Denies bruising, bleeding, and enlarged lymph nodes.   Physical Exam   BP 127/76   Pulse 60   Temp (!) 97.2 F (36.2 C)   Ht 5\' 9"  (1.753 m)   Wt 147 lb 12.8 oz (67 kg)   BMI 21.83 kg/m  General:   Alert and oriented. Pleasant and cooperative. Well-nourished and well-developed.  Head:  Normocephalic and atraumatic. Eyes:  Without icterus Abdomen:  +BS, soft, non-tender and non-distended. No HSM noted. No guarding or rebound. No masses appreciated.  Rectal:  Deferred  Msk:  Symmetrical without gross deformities. Normal posture. Extremities:  Without edema. Neurologic:  Alert and  oriented x4;  grossly normal neurologically. Skin:  Intact without significant lesions or rashes. Psych:  Alert and cooperative. Normal mood and affect.   Assessment   Richard Clarke is a 37 y.o. male presenting today with a history of  chronic abdominal pain present for over 6-7 years, initially seen in clinic in 2019 and last seen in 2023.   Chronic LLQ abdominal pain: prior evaluation as above. CT last many years ago. Unable to rule out slow colonic transit as contributing as he reports BM about every 3 days; however, he does not have improvement after bowel movement. Associated gas noted. Will update CT scan. Start Miralax each evening prn. Doubt dealing with an underlying occult IBD. Colonoscopy/EGD on file last year.   GERD: unable to wean down to BID PPI. He has made significant dietary changes.   Reports elevated transaminases last year April 2024: reviewed in [facility] with AST 66 and ALT 126. Subsequent labs June this year normal. Will update now. He has no RUQ abdominal pain or biliary type pain.   PLAN    CT abd/pelvis with contrast Update labs today Add Miralax each evening if no BM Low FODMAP diet Further recommendations to follow    Gelene Mink, PhD,  ANP-BC [facility]

## 2022-12-10 NOTE — Patient Instructions (Signed)
Please have blood work done. We are also arranging a CT scan!  I recommend taking 1 capful of Miralax in the evening in water or juice any day you don't have a bowel movement.  I also recommend trying to follow the low FODMAP diet for the next few weeks and gradually incorporate back in the foods from the other column to see what triggers.  Further recommendations to follow!  I enjoyed seeing you again today! I value our relationship and want to provide genuine, compassionate, and quality care. You may receive a survey regarding your visit with me, and I welcome your feedback! Thanks so much for taking the time to complete this. I look forward to seeing you again.      Gelene Mink, PhD, ANP-BC [facility]

## 2022-12-10 NOTE — Telephone Encounter (Signed)
Called pt, no answer and VM full. Needs to give CT appt details. 11/5, arrival 12pm to start drinking oral contrast, npo 4 hrs prior

## 2022-12-11 NOTE — Telephone Encounter (Signed)
Called pt, no answer on mobile. Called home # [id number]

## 2022-12-12 NOTE — Telephone Encounter (Signed)
Called pt again, no answer. Letter mailed with appt information

## 2022-12-13 NOTE — Telephone Encounter (Signed)
Pt informed of CT appt date, time and instructions, verbalized understanding.

## 2022-12-23 ENCOUNTER — Ambulatory Visit: Payer: Commercial Managed Care - PPO | Admitting: Adult Health

## 2023-01-07 ENCOUNTER — Ambulatory Visit (HOSPITAL_COMMUNITY)
Admission: RE | Admit: 2023-01-07 | Discharge: 2023-01-07 | Disposition: A | Discharge status: Home / Self Care | Payer: Commercial Managed Care - PPO | Source: Ambulatory Visit | Attending: Gastroenterology | Admitting: Gastroenterology

## 2023-01-07 DIAGNOSIS — R1032 Left lower quadrant pain: Secondary | ICD-10-CM | POA: Diagnosis present

## 2023-01-07 MED ORDER — IOHEXOL 300 MG/ML  SOLN
100.0000 mL | Freq: Once | INTRAMUSCULAR | Status: AC | PRN
  Administered 2023-01-07: 100 mL via INTRAVENOUS

## 2023-01-13 ENCOUNTER — Telehealth: Payer: Self-pay

## 2023-01-13 NOTE — Telephone Encounter (Signed)
Richard Clarke, Pt called wanting his results of his CT scan of abd/pelvis. Pt still states he is sick and if you don't reach him because of his phone signal and he wanted to speak to you. Please advise

## 2023-01-14 NOTE — Telephone Encounter (Signed)
Pt called again to check on results of his CT

## 2023-01-14 NOTE — Telephone Encounter (Signed)
I have been out of town. CT is normal.   Needs to take Miralax each day and complete labs ordered from office visit (CBC and CMP).   Has he been doing the low FODMAP diet?

## 2023-01-14 NOTE — Telephone Encounter (Signed)
FYI:  Pt came by the office was given a copy of this note. Pt advised of CT report / have labs done / follow FODMAP diet which he admits to not doing. Pt is going to get his bloodwork done

## 2023-03-19 ENCOUNTER — Ambulatory Visit (INDEPENDENT_AMBULATORY_CARE_PROVIDER_SITE_OTHER): Payer: Medicaid Other | Admitting: Podiatry

## 2023-03-19 DIAGNOSIS — Z91199 Patient's noncompliance with other medical treatment and regimen due to unspecified reason: Secondary | ICD-10-CM

## 2023-03-19 NOTE — Progress Notes (Signed)
 No show

## 2023-04-07 ENCOUNTER — Encounter: Payer: Self-pay | Admitting: Podiatry

## 2023-04-07 ENCOUNTER — Ambulatory Visit (INDEPENDENT_AMBULATORY_CARE_PROVIDER_SITE_OTHER): Payer: Medicaid Other | Admitting: Podiatry

## 2023-04-07 DIAGNOSIS — B351 Tinea unguium: Secondary | ICD-10-CM | POA: Diagnosis not present

## 2023-04-07 NOTE — Addendum Note (Signed)
Addended by: Daryel November on: 04/07/2023 05:00 PM   Modules accepted: Orders

## 2023-04-07 NOTE — Progress Notes (Signed)
  Subjective:  Patient ID: Richard Clarke, male    DOB: 1985/11/21,   MRN: [id number]  Chief Complaint  Patient presents with   Nail Problem    Pt presents for  bil toenail fungus that has spread out on all his toenails pt stated.    38 y.o. male presents for concern of fungal toenails that have been a problem for him for several years. He has tried some OTC treatments like kerasal but has not made much of a difference. Does relates the nails have fallen off in the past and has had some injury to the toenails and bruising especially when wearing certain shoes.  . Denies any other pedal complaints. Denies n/v/f/c.   Past Medical History:  Diagnosis Date   Asthma    GERD (gastroesophageal reflux disease)    Migraines    Tonsillar calculus     Objective:  Physical Exam: Vascular: DP/PT pulses 2/4 bilateral. CFT <3 seconds. Normal hair growth on digits. No edema.  Skin. No lacerations or abrasions bilateral feet. Hallux nails bilateral are thickened dystrophic and discolored with subungudal debris some ecchymosis noted to bilateral fourth digits as well Musculoskeletal: MMT 5/5 bilateral lower extremities in DF, PF, Inversion and Eversion. Deceased ROM in DF of ankle joint.  Neurological: Sensation intact to light touch.   Assessment:   1. Onychomycosis      Plan:  Patient was evaluated and treated and all questions answered. -Examined patient -Discussed treatment options for painful dystrophic nails  -Clinical picture and Fungal culture was obtained by removing a portion of the hard nail itself from each of the involved toenails using a sterile nail nipper and sent to [facility] lab. Patient tolerated the biopsy procedure well without discomfort or need for anesthesia.  -Discussed fungal nail treatment options including oral, topical, and laser treatments.  -Patient to return in 4 weeks for follow up evaluation and discussion of fungal culture results or sooner if symptoms  worsen.   Louann Sjogren, DPM

## 2023-04-24 ENCOUNTER — Other Ambulatory Visit: Payer: Self-pay | Admitting: Podiatry

## 2023-05-05 ENCOUNTER — Ambulatory Visit (INDEPENDENT_AMBULATORY_CARE_PROVIDER_SITE_OTHER): Payer: Medicaid Other | Admitting: Podiatry

## 2023-05-05 ENCOUNTER — Encounter: Payer: Self-pay | Admitting: Podiatry

## 2023-05-05 VITALS — Ht 69.0 in | Wt 147.8 lb

## 2023-05-05 DIAGNOSIS — B351 Tinea unguium: Secondary | ICD-10-CM

## 2023-05-05 MED ORDER — TERBINAFINE HCL 250 MG PO TABS: 250.0000 mg | ORAL_TABLET | Freq: Every day | ORAL | 2 refills | Status: AC

## 2023-05-05 NOTE — Progress Notes (Signed)
  Subjective:  Patient ID: Richard Clarke, male    DOB: 28-Aug-1985,   MRN: [id number]  Chief Complaint  Patient presents with   Nail Problem    "  I am here to go over the nail culture results"    38 y.o. male presents for follow-up of fungal nails and to review cultures.  . Denies any other pedal complaints. Denies n/v/f/c.   Past Medical History:  Diagnosis Date   Asthma    GERD (gastroesophageal reflux disease)    Migraines    Tonsillar calculus     Objective:  Physical Exam: Vascular: DP/PT pulses 2/4 bilateral. CFT <3 seconds. Normal hair growth on digits. No edema.  Skin. No lacerations or abrasions bilateral feet. Hallux nails bilateral are thickened dystrophic and discolored with subungudal debris some ecchymosis noted to bilateral fourth digits as well Musculoskeletal: MMT 5/5 bilateral lower extremities in DF, PF, Inversion and Eversion. Deceased ROM in DF of ankle joint.  Neurological: Sensation intact to light touch.   Assessment:   1. Onychomycosis       Plan:  Patient was evaluated and treated and all questions answered. -Examined patient -Discussed treatment options for painful dystrophic nails  -Culture positive for fungus and pigment present  -Discussed fungal nail treatment options including oral, topical, and laser treatments.  -Lamisil sent to pharmacy x 90 days. LFTs wnl most recently.  -Patient to return in 3 months for recheck    Louann Sjogren, DPM

## 2023-05-12 ENCOUNTER — Telehealth: Payer: Self-pay | Admitting: Podiatry

## 2023-05-12 NOTE — Telephone Encounter (Signed)
 Pt called chjecking status of medication that was to be sent in and checked the chart and it was sent to [facility]. Pt said it was to have been sent to [facility] but stated he would go pick it up from the [facility] pharmacy.

## 2023-08-06 ENCOUNTER — Ambulatory Visit (INDEPENDENT_AMBULATORY_CARE_PROVIDER_SITE_OTHER): Admitting: Podiatry

## 2023-08-06 DIAGNOSIS — Z91199 Patient's noncompliance with other medical treatment and regimen due to unspecified reason: Secondary | ICD-10-CM

## 2023-08-06 NOTE — Progress Notes (Signed)
 No show

## 2023-09-08 NOTE — Progress Notes (Signed)
 09/09/2023 12:20 PM   Richard Clarke 1986-02-27 [id number]  Referring provider: Rosamond Leta NOVAK, MD [address] [place],  [place] [address]  No chief complaint on file.   HPI: 38 year old male here for evaluation and management of right scrotal swelling.  He had CT of his abdomen and pelvis in May of this year.  That was performed for abdominal discomfort.  This showed a small right hydrocele.  Follow-up scrotal ultrasound confirmed this, as well as normal-appearing testicles.    PMH: Past Medical History:  Diagnosis Date   Asthma    GERD (gastroesophageal reflux disease)    Migraines    Tonsillar calculus     Surgical History: Past Surgical History:  Procedure Laterality Date   BIOPSY  10/15/2021   Procedure: BIOPSY;  Surgeon: Cindie Carlin POUR, DO;  [facility];  Service: Endoscopy;;   COLONOSCOPY WITH PROPOFOL  N/A 10/15/2021   Procedure: COLONOSCOPY WITH PROPOFOL ;  Surgeon: Cindie Carlin POUR, DO;  Location: [facility];  Service: Endoscopy;  Laterality: N/A;  1245   ESOPHAGOGASTRODUODENOSCOPY (EGD) WITH PROPOFOL  N/A 10/15/2021   Procedure: ESOPHAGOGASTRODUODENOSCOPY (EGD) WITH PROPOFOL ;  Surgeon: Cindie Carlin POUR, DO;  Location: [facility];  Service: Endoscopy;  Laterality: N/A;   None     POLYPECTOMY  10/15/2021   Procedure: POLYPECTOMY;  Surgeon: Cindie Carlin POUR, DO;  Location: [facility];  Service: Endoscopy;;   TONSILLECTOMY Bilateral 06/17/2022   Procedure: TONSILLECTOMY;  Surgeon: Jesus Oliphant, MD;  Location: [facility];  Service: ENT;  Laterality: Bilateral;    Home Medications:  Allergies as of 09/09/2023       Reactions   Hydrocodone -acetaminophen  Other (See Comments)   Hallucination   Levaquin [levofloxacin] Rash        Medication List        Accurate as of September 08, 2023 12:20 PM. If you have any questions, ask your nurse or doctor.          acetaminophen  500 MG tablet Commonly known as: TYLENOL  Take 1,000 mg  by mouth every 6 (six) hours as needed for moderate pain.   albuterol 108 (90 Base) MCG/ACT inhaler Commonly known as: VENTOLIN HFA Inhale 2 puffs into the lungs every 6 (six) hours as needed for wheezing.   cetirizine 10 MG tablet Commonly known as: ZYRTEC Take 10 mg by mouth daily as needed for allergies.   ibuprofen  800 MG tablet Commonly known as: ADVIL  Take 1 tablet (800 mg total) by mouth every 6 (six) hours as needed for moderate pain.   Nurtec 75 MG Tbdp Generic drug: Rimegepant Sulfate Take 1 tablet (75 mg total) by mouth daily as needed. For migraines. Take as close to onset of migraine as possible. One daily maximum.   pantoprazole  40 MG tablet Commonly known as: Protonix  Take 1 tablet (40 mg total) by mouth 2 (two) times daily before a meal.   rizatriptan  10 MG disintegrating tablet Commonly known as: MAXALT -MLT Take 1 tablet (10 mg total) by mouth as needed for migraine. May repeat in 2 hours if needed   sertraline 25 MG tablet Commonly known as: ZOLOFT Take 25 mg by mouth daily.        Allergies:  Allergies  Allergen Reactions   Hydrocodone -Acetaminophen  Other (See Comments)    Hallucination   Levaquin [Levofloxacin] Rash    Family History: Family History  Problem Relation Age of Onset   Throat cancer Father    Colon cancer Maternal Aunt  passed away at age 57    Colon polyps Neg Hx    Migraines Neg Hx     Social History:  reports that he has never smoked. He has never been exposed to tobacco smoke. He has never used smokeless tobacco. He reports current alcohol use. He reports that he does not currently use drugs.  ROS: All other review of systems were reviewed and are negative except what is noted above in HPI  Physical Exam: There were no vitals taken for this visit.  Constitutional:  Alert and oriented, No acute distress. HEENT: [place] AT, moist mucus membranes.  Trachea midline, no masses. Cardiovascular: No clubbing, cyanosis, or  edema. Respiratory: Normal respiratory effort, no increased work of breathing. GI: No inguinal hernias GU: Normal phallus.  Scrotal skin normal.  Tiny right hydrocele.  Testicles normal bilaterally. Lymph: No cervical or inguinal lymphadenopathy. Skin: No rashes, bruises or suspicious lesions. Neurologic: Grossly intact, no focal deficits, moving all 4 extremities. Psychiatric: Normal mood and affect.  Laboratory Data: Lab Results  Component Value Date   WBC 5.0 08/12/2022   HGB 14.7 08/12/2022   HCT 45.5 08/12/2022   MCV 82 08/12/2022   PLT 320 08/12/2022    Lab Results  Component Value Date   CREATININE 0.84 08/12/2022    No results found for: PSA  No results found for: TESTOSTERONE  No results found for: HGBA1C  Urinalysis   Pertinent Imaging: I reviewed the scrotal ultrasound transcription, also, reviewed CT images and transcription  Assessment:  Small asymptomatic right hydrocele  Plan:   Patient reassured about his exam  Discussion was held on hydroceles, and the fact that they can resolve or get worse  He will return as needed There are no diagnoses linked to this encounter.  No follow-ups on file.  Garnette CHRISTELLA Shack, MD  [facility] [place]

## 2023-09-09 ENCOUNTER — Ambulatory Visit: Payer: Self-pay | Admitting: Urology

## 2023-09-09 VITALS — BP 118/62 | HR 70

## 2023-09-09 DIAGNOSIS — N433 Hydrocele, unspecified: Secondary | ICD-10-CM

## 2023-09-29 IMAGING — DX DG CHEST 1V PORT
1 series · 2 of 2 positions shown · non-contrast
Comparison: 03/09/2016

CLINICAL DATA: Chest pain, shortness of breath

EXAM:
PORTABLE CHEST 1 VIEW

[Series 1: chest ap · 0.14mm/px · 2 of 2 slices shown]
[im 1/2]
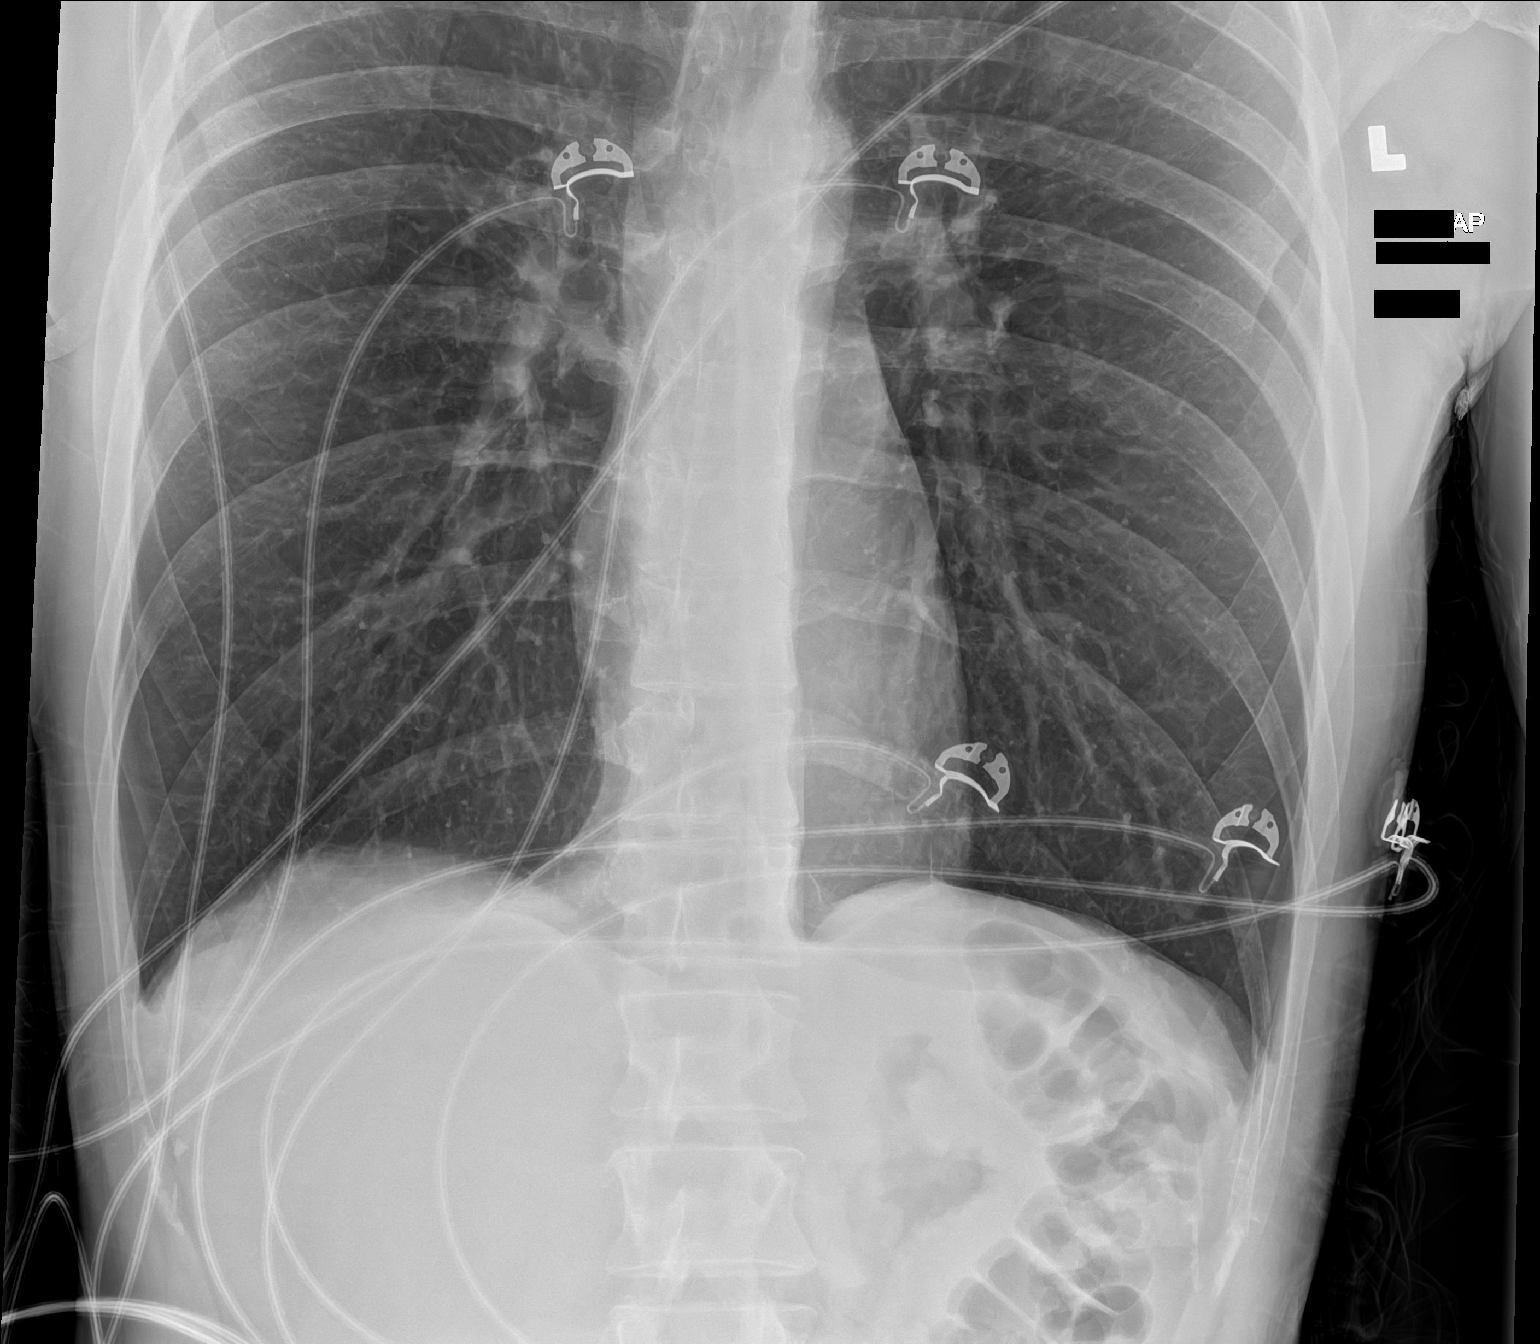
[im 2/2]
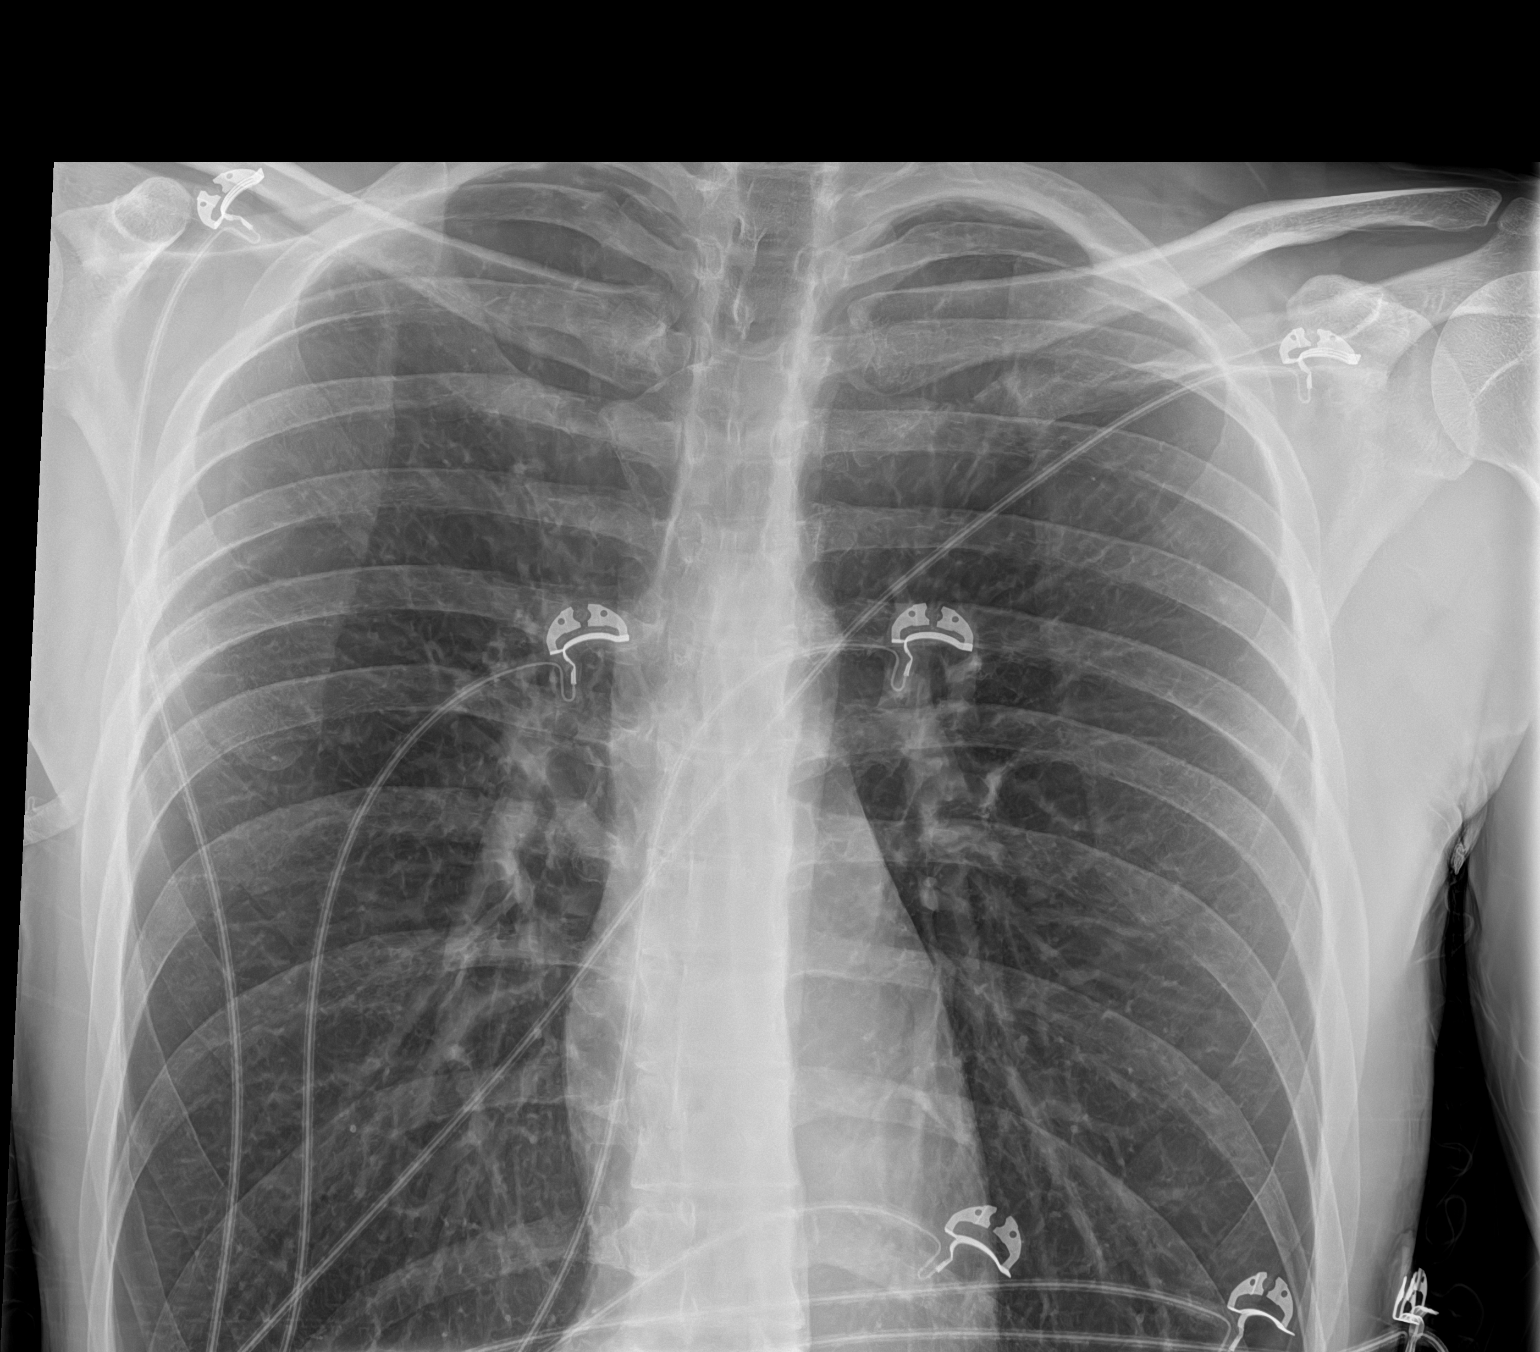

[2 of 2 positions shown; findings below may reference images not displayed]

FINDINGS: Lungs are clear.  No pleural effusion or pneumothorax.

The heart is normal in size.
IMPRESSION: No evidence of acute cardiopulmonary disease.

## 2023-10-14 NOTE — ED Triage Notes (Signed)
 Pt states he thinks he is dehydrated. Pt reports feeling like his body is locking up pt reports having pain all over, feeling dizzy and weak and also states he has a headache. Pt says he works outside [employer]

## 2023-10-14 NOTE — ED Provider Notes (Signed)
 [facility]  Emergency Department Provider Note     History   Chief Complaint Pain and Weakness   HPI  Richard Clarke is a 38 y.o. male LAC since the beginning of this year show or talk about a lease for 8 months has had sweating more so than normal just does not feel good sometimes he has cramping in his body.  Has been checked by his primary care physician with labs and CT of the abdomen pelvis with negative findings.  History of asthma    Past Medical History[1]  Past Surgical History[2]  Prior to Admission medications  Medication Dose, Route, Frequency  acetaminophen (ACETAMINOPHEN EXTRA STRENGTH) 500 MG tablet 1,000 mg, Oral, Every 6 hours PRN  cetirizine (ZYRTEC) 10 MG tablet 10 mg, Oral, Daily (standard)  cyclobenzaprine (FLEXERIL) 10 MG tablet 10 mg, Oral, 2 times a day PRN  desonide (DESOWEN) 0.05 % lotion 1 Application, Topical  famotidine (PEPCID) 20 MG tablet 20 mg, Oral, Daily  ibuprofen (MOTRIN) 600 MG tablet 600 mg, Oral, 3 times a day (standard)  montelukast (SINGULAIR) 10 mg tablet 10 mg, Oral, Nightly  sumatriptan (IMITREX) 50 MG tablet 50 mg, Oral    Allergies Levofloxacin   Short Social History[3]  Review of Systems  Endo/Heme/Allergies:        Excessive sweating  All other systems reviewed and are negative.   As in HPI, all systems reviewed and otherwise negative.  Physical Exam    Vitals:   10/14/23 2218  BP: 140/82  Pulse: 86  Resp: 18  Temp: 36.5 C (97.7 F)  TempSrc: Temporal  SpO2: 100%  Weight: 66 kg (145 lb 6.4 oz)  Height: 175.3 cm (5' 9)     Physical Exam  Constitutional: Patient appears well-developed and well nourished. Non toxic in appearance. HEENT: Unremarkable. Head: Atraumatic.  Eyes: Normal ocular movements. Neck: Supple with normal range of motion.  Pulmonary/Chest: Effort normal. No respiratory distress. Abdominal: Soft and non tender abdomen. Musculoskeletal: Extremities  atraumatic. Neurological: Alert with no focal neurological deficit. Ambulatory with a steady gait. Skin: Warm and dry.  Nursing note and vital signs reviewed.   ED Course        Procedures  Medications ordered during this encounter  Medications  . sodium chloride 0.9% (NS) bolus 1,000 mL    ED Results Results for orders placed or performed during the hospital encounter of 10/14/23  CBC w/ Differential  Result Value Ref Range   WBC 14.6 (H) 4.0 - 10.5 10*9/L   RBC 5.74 (H) 4.10 - 5.60 10*12/L   HGB 15.5 12.5 - 17.0 g/dL   HCT 53.9 63.9 - 49.9 %   MCV 80.1 80.0 - 98.0 fL   MCH 27.0 27.0 - 34.0 pg   MCHC 33.7 32.0 - 36.0 g/dL   RDW 85.4 88.4 - 85.4 %   MPV 9.2 7.4 - 10.4 fL   Platelet 380 140 - 415 10*9/L   Neutrophils % 73.3 %   Lymphocytes % 15.7 %   Monocytes % 9.0 %   Eosinophils % 1.0 %   Basophils % 0.6 %   Absolute Neutrophils 10.7 (H) 1.8 - 7.8 10*9/L   Absolute Lymphocytes 2.3 0.7 - 4.5 10*9/L   Absolute Monocytes 1.3 (H) 0.1 - 1.0 10*9/L   Absolute Eosinophils 0.1 0.0 - 0.4 10*9/L   Absolute Basophils 0.1 0.0 - 0.2 10*9/L    Radiology No results found.   Medical Decision Making   I have reviewed the vital  signs and the nursing notes. Labs and radiology results that were available during my care of the patient were independently reviewed by me and considered in my medical decision making.    Comfortable appearing with no obvious distress.  Labs notified in detail  Medical Decision Making    Differential Diagnosis: Viral syndrome, dehydration, hypovolemia, generalized fatigue, excessive sweating     ED Clinical Impression   Final diagnoses:  None  Heat exhaustion Excessive sweating Generalized fatigue tiredness  Procedures      This record has been created using Animal nutritionist. Chart creation errors have been sought, but may not always have been located. Such creation errors do not reflect on the standard of medical care.        [1] Past Medical History: Diagnosis Date  . Asthma ([facility]HS-HCC)   . Migraines   [2] No past surgical history on file. [3] Social History Tobacco Use  . Smoking status: Never  . Smokeless tobacco: Never  Substance Use Topics  . Alcohol use: Never  . Drug use: Yes    Types: Marijuana   Maree Jonelle Lash, MD 10/14/23 [phone]

## 2023-10-17 ENCOUNTER — Emergency Department (HOSPITAL_COMMUNITY)

## 2023-10-17 ENCOUNTER — Encounter (HOSPITAL_COMMUNITY): Payer: Self-pay

## 2023-10-17 ENCOUNTER — Other Ambulatory Visit: Payer: Self-pay

## 2023-10-17 ENCOUNTER — Emergency Department (HOSPITAL_COMMUNITY)
Admission: EM | Admit: 2023-10-17 | Discharge: 2023-10-17 | Disposition: A | Discharge status: Home / Self Care | Attending: Emergency Medicine | Admitting: Emergency Medicine

## 2023-10-17 DIAGNOSIS — D72829 Elevated white blood cell count, unspecified: Secondary | ICD-10-CM | POA: Diagnosis not present

## 2023-10-17 DIAGNOSIS — R252 Cramp and spasm: Secondary | ICD-10-CM | POA: Insufficient documentation

## 2023-10-17 DIAGNOSIS — E860 Dehydration: Secondary | ICD-10-CM

## 2023-10-17 DIAGNOSIS — R7989 Other specified abnormal findings of blood chemistry: Secondary | ICD-10-CM | POA: Diagnosis not present

## 2023-10-17 DIAGNOSIS — N179 Acute kidney failure, unspecified: Secondary | ICD-10-CM

## 2023-10-17 DIAGNOSIS — F419 Anxiety disorder, unspecified: Secondary | ICD-10-CM

## 2023-10-17 LAB — CBC WITH DIFFERENTIAL/PLATELET
Abs Immature Granulocytes: 0.07 K/uL (ref 0.00–0.07)
Basophils Absolute: 0.1 K/uL (ref 0.0–0.1)
Basophils Relative: 1 %
Eosinophils Absolute: 0.1 K/uL (ref 0.0–0.5)
Eosinophils Relative: 1 %
HCT: 45.4 % (ref 39.0–52.0)
Hemoglobin: 15.4 g/dL (ref 13.0–17.0)
Immature Granulocytes: 0 %
Lymphocytes Relative: 11 %
Lymphs Abs: 1.9 K/uL (ref 0.7–4.0)
MCH: 27.3 pg (ref 26.0–34.0)
MCHC: 33.9 g/dL (ref 30.0–36.0)
MCV: 80.5 fL (ref 80.0–100.0)
Monocytes Absolute: 1.3 K/uL — ABNORMAL HIGH (ref 0.1–1.0)
Monocytes Relative: 8 %
Neutro Abs: 13.8 K/uL — ABNORMAL HIGH (ref 1.7–7.7)
Neutrophils Relative %: 79 %
Platelets: 361 K/uL (ref 150–400)
RBC: 5.64 MIL/uL (ref 4.22–5.81)
RDW: 14.1 % (ref 11.5–15.5)
WBC: 17.3 K/uL — ABNORMAL HIGH (ref 4.0–10.5)
nRBC: 0 % (ref 0.0–0.2)

## 2023-10-17 LAB — COMPREHENSIVE METABOLIC PANEL WITH GFR
ALT: 42 U/L (ref 0–44)
AST: 43 U/L — ABNORMAL HIGH (ref 15–41)
Albumin: 5.1 g/dL — ABNORMAL HIGH (ref 3.5–5.0)
Alkaline Phosphatase: 51 U/L (ref 38–126)
Anion gap: 15 (ref 5–15)
BUN: 21 mg/dL — ABNORMAL HIGH (ref 6–20)
CO2: 21 mmol/L — ABNORMAL LOW (ref 22–32)
Calcium: 10 mg/dL (ref 8.9–10.3)
Chloride: 99 mmol/L (ref 98–111)
Creatinine, Ser: 1.49 mg/dL — ABNORMAL HIGH (ref 0.61–1.24)
GFR, Estimated: >60 mL/min (ref >60)
Glucose, Bld: 88 mg/dL (ref 70–99)
Potassium: 4.2 mmol/L (ref 3.5–5.1)
Sodium: 135 mmol/L (ref 135–145)
Total Bilirubin: 1 mg/dL (ref 0.0–1.2)
Total Protein: 8.4 g/dL — ABNORMAL HIGH (ref 6.5–8.1)

## 2023-10-17 LAB — CK: Total CK: 330 U/L (ref 49–397)

## 2023-10-17 LAB — MAGNESIUM: Magnesium: 2.1 mg/dL (ref 1.7–2.4)

## 2023-10-17 MED ORDER — SODIUM CHLORIDE 0.9 % IV BOLUS
1000.0000 mL | Freq: Once | INTRAVENOUS | Status: AC
  Administered 2023-10-17: 1000 mL via INTRAVENOUS

## 2023-10-17 MED ORDER — DIAZEPAM 5 MG/ML IJ SOLN
5.0000 mg | Freq: Once | INTRAMUSCULAR | Status: AC
  Administered 2023-10-17: 5 mg via INTRAVENOUS
  Filled 2023-10-17: qty 2

## 2023-10-17 MED ORDER — LORAZEPAM 0.5 MG PO TABS: 0.2500 mg | ORAL_TABLET | Freq: Two times a day (BID) | ORAL | 0 refills | Status: AC | PRN

## 2023-10-17 NOTE — ED Triage Notes (Addendum)
 Rcems from home.  Called ems for cramps and spasms in extremities.  Worked outside in heat all day and has been vomiting after work. Was at [facility] yesterday for dehydration.  18 l ac 500ml NS.   Went to pcp today for a follow up. They put him on antibiotics for a sinus infection

## 2023-10-17 NOTE — Discharge Instructions (Signed)
 Please follow-up closely with your primary care doctor on an outpatient basis for reevaluation.  Return to the emergency department immediately for any new or worsening symptoms.

## 2023-10-17 NOTE — ED Provider Notes (Signed)
 [place] EMERGENCY DEPARTMENT AT [facility] Provider Note   CSN: [id number] Arrival date & time: 10/17/23  1943     Patient presents with: Spasms and Heat Exposure   Richard Clarke is a 38 y.o. male.   Patient is a 38 year old male who presents emergency department the chief complaint of generalized muscle cramps, nausea, vomiting.  Patient notes he has been working outside in the heat all day long.  Patient is concerned that he may be dehydrated at this point.  He notes that he was seen at another emergency department approximately 3 days ago for similar complaint.  He did follow-up with his primary care doctor yesterday and was placed on antibiotics for possible sinus infection.  He notes he has been compliant with all other medications.  He is currently on Zoloft but has not had any recent adjustments to this medication.  He denies any associate abdominal pain.  He has had no associated chest pain or palpitations.  He does admit to some recurrent headaches.  He denies any numbness, paresthesias or unilateral weakness.        Prior to Admission medications   Medication Sig Start Date End Date Taking? Authorizing Provider  acetaminophen (TYLENOL) 500 MG tablet Take 1,000 mg by mouth every 6 (six) hours as needed for moderate pain.    [provider]  albuterol (PROVENTIL HFA;VENTOLIN HFA) 108 (90 BASE) MCG/ACT inhaler Inhale 2 puffs into the lungs every 6 (six) hours as needed for wheezing.    [provider]  amoxicillin-clavulanate (AUGMENTIN) 500-125 MG tablet Take 500 mg by mouth in the morning and at bedtime. 10/16/23   [provider]  cetirizine (ZYRTEC) 10 MG tablet Take 10 mg by mouth daily as needed for allergies.    [provider]  cyclobenzaprine (FLEXERIL) 10 MG tablet Take 10 mg by mouth at bedtime. 10/08/23   [provider]  ibuprofen (ADVIL) 800 MG tablet Take 1 tablet (800 mg total) by mouth every 6 (six) hours  as needed for moderate pain. 04/15/21   Haze Lonni PARAS, MD  montelukast (SINGULAIR) 10 MG tablet Take 10 mg by mouth daily.    [provider]  pantoprazole (PROTONIX) 40 MG tablet Take 1 tablet (40 mg total) by mouth 2 (two) times daily before a meal. 12/10/22 12/10/23  Shirlean Therisa ORN, NP  Rimegepant Sulfate (NURTEC) 75 MG TBDP Take 1 tablet (75 mg total) by mouth daily as needed. For migraines. Take as close to onset of migraine as possible. One daily maximum. 08/12/22   Ines Onetha NOVAK, MD  rizatriptan (MAXALT-MLT) 10 MG disintegrating tablet Take 1 tablet (10 mg total) by mouth as needed for migraine. May repeat in 2 hours if needed 08/12/22   Ines Onetha NOVAK, MD  sertraline (ZOLOFT) 25 MG tablet Take 25 mg by mouth daily. 11/22/22   [provider]    Allergies: Hydrocodone-acetaminophen and Levaquin [levofloxacin]    Review of Systems  Musculoskeletal:  Positive for myalgias.  All other systems reviewed and are negative.   Updated Vital Signs BP (!) 135/97   Pulse 91   Temp 98.7 F (37.1 C) (Oral)   Resp 17   Ht 5' 9 (1.753 m)   Wt 69.9 kg   SpO2 100%   BMI 22.74 kg/m   Physical Exam Vitals and nursing note reviewed.  Constitutional:      Appearance: Normal appearance.  HENT:     Head: Normocephalic and atraumatic.  Nose: Nose normal.     Mouth/Throat:     Mouth: Mucous membranes are moist.  Eyes:     Extraocular Movements: Extraocular movements intact.     Conjunctiva/sclera: Conjunctivae normal.     Pupils: Pupils are equal, round, and reactive to light.  Cardiovascular:     Rate and Rhythm: Normal rate and regular rhythm.     Pulses: Normal pulses.     Heart sounds: Normal heart sounds. No murmur heard.    No gallop.  Pulmonary:     Effort: Pulmonary effort is normal. No respiratory distress.     Breath sounds: Normal breath sounds. No stridor. No wheezing, rhonchi or rales.  Abdominal:     General: Abdomen is flat. Bowel sounds are  normal. There is no distension.     Palpations: Abdomen is soft.     Tenderness: There is no abdominal tenderness. There is no guarding.  Musculoskeletal:        General: Normal range of motion.     Cervical back: Normal range of motion and neck supple. No rigidity or tenderness.  Skin:    General: Skin is warm and dry.     Coloration: Skin is not jaundiced or pale.     Findings: No bruising, erythema or rash.  Neurological:     General: No focal deficit present.     Mental Status: He is alert and oriented to person, place, and time. Mental status is at baseline.     Cranial Nerves: No cranial nerve deficit.     Sensory: No sensory deficit.     Motor: No weakness.     Coordination: Coordination normal.     Gait: Gait normal.  Psychiatric:        Mood and Affect: Mood normal.        Behavior: Behavior normal.        Thought Content: Thought content normal.        Judgment: Judgment normal.     (all labs ordered are listed, but only abnormal results are displayed) Labs Reviewed  COMPREHENSIVE METABOLIC PANEL WITH GFR - Abnormal; Notable for the following components:      Result Value   CO2 21 (*)    BUN 21 (*)    Creatinine, Ser 1.49 (*)    Total Protein 8.4 (*)    Albumin 5.1 (*)    AST 43 (*)    All other components within normal limits  CBC WITH DIFFERENTIAL/PLATELET - Abnormal; Notable for the following components:   WBC 17.3 (*)    Neutro Abs 13.8 (*)    Monocytes Absolute 1.3 (*)    All other components within normal limits  MAGNESIUM  CK    EKG: EKG Interpretation Date/Time:  Friday October 17 2023 20:02:39 EDT Ventricular Rate:  103 PR Interval:  148 QRS Duration:  73 QT Interval:  319 QTC Calculation: 418 R Axis:   81  Text Interpretation: Sinus tachycardia Baseline wander in lead(s) V2 V3 Confirmed by Patsey Lot [phone]) on 10/17/2023 10:09:26 PM  Radiology: CT Head Wo Contrast Result Date: 10/17/2023 EXAM: CT HEAD WITHOUT CONTRAST 10/17/2023  09:41:19 PM TECHNIQUE: CT of the head was performed without the administration of intravenous contrast. Automated exposure control, iterative reconstruction, and/or weight based adjustment of the mA/kV was utilized to reduce the radiation dose to as low as reasonably achievable. COMPARISON: None available. CLINICAL HISTORY: Headache, increasing frequency or severity. Rcems from home. Called ems for cramps and spasms in extremities. They put  him on antibiotics for a sinus infection. FINDINGS: BRAIN AND VENTRICLES: No acute hemorrhage. Gray-white differentiation is preserved. No hydrocephalus. No extra-axial collection. No mass effect or midline shift. ORBITS: No acute abnormality. SINUSES: No acute abnormality. SOFT TISSUES AND SKULL: No acute soft tissue abnormality. No skull fracture. IMPRESSION: 1. No acute intracranial abnormality. Electronically signed by: Donnice Mania MD 10/17/2023 09:48 PM EDT RP Workstation: [id number]   DG Chest Port 1 View Result Date: 10/17/2023 CLINICAL DATA:  Weakness.  Spasms.  Vomiting EXAM: PORTABLE CHEST 1 VIEW COMPARISON:  None Available. FINDINGS: Normal mediastinum and cardiac silhouette. Normal pulmonary vasculature. No evidence of effusion, infiltrate, or pneumothorax. No acute bony abnormality. IMPRESSION: No acute cardiopulmonary process. Electronically Signed   By: Jackquline Boxer M.D.   On: 10/17/2023 21:23     Procedures   Medications Ordered in the ED  sodium chloride 0.9 % bolus 1,000 mL (0 mLs Intravenous Stopped 10/17/23 2209)  diazepam (VALIUM) injection 5 mg (5 mg Intravenous Given 10/17/23 2030)  sodium chloride 0.9 % bolus 1,000 mL (1,000 mLs Intravenous New Bag/Given 10/17/23 2208)                                    Medical Decision Making Patient is feeling much better at this time and is stable for discharge home.  Vital signs are stable at this point and tachycardia has resolved.  He notes that muscle cramps have resolved with IV hydration in  the emergency department.  He does have an elevated creatinine and was given 2 L of fluids in the emergency department.  He has no associate abdominal pain and has been urinating at his baseline.  CK is within normal limits and do not suspect rhabdomyolysis.  Electrolytes are within normal limits as well.  He does have an unexplained leukocytosis with no other clear source for infection at this point.  Do suspect this may be secondary to demand from his dehydration.  I do not suspect any further workup is warranted at this point.  Do not suspect underlying etiology such as serotonin syndrome.  Discussed with patient about the need for close follow-up with his primary care doctor on outpatient basis for continued valuation as well as continue good oral hydration.  Strict turn precautions were discussed for any new or worsening symptoms.  Patient voiced understanding to the plan and had no additional questions. Patient case was fully discussed with attending physician who is in agreement to plan at this time.   Amount and/or Complexity of Data Reviewed Labs: ordered. Radiology: ordered.  Risk Prescription drug management.        Final diagnoses:  None    ED Discharge Orders     None          Kendi Defalco D, PA-C 10/17/23 2239    Patsey Lot, MD 10/18/23 1332

## 2024-03-01 ENCOUNTER — Ambulatory Visit: Admitting: Podiatry

## 2024-03-01 DIAGNOSIS — Z91199 Patient's noncompliance with other medical treatment and regimen due to unspecified reason: Secondary | ICD-10-CM

## 2024-03-01 NOTE — Progress Notes (Signed)
 No show

## 2024-03-15 NOTE — Progress Notes (Unsigned)
 "   Assessment:  Small asymptomatic right hydrocele  Plan:   Patient reassured about his exam  Discussion was held on hydroceles, and the fact that they can resolve or get worse  He will return as needed   HPI:   7.8.2025: 39 year old male here for evaluation and management of right scrotal swelling.  He had CT of his abdomen and pelvis in May of this year.  That was performed for abdominal discomfort.  This showed a small right hydrocele.  Follow-up scrotal ultrasound confirmed this, as well as normal-appearing testicles.  1.13.2026:   PMH: Past Medical History:  Diagnosis Date   Asthma    GERD (gastroesophageal reflux disease)    Migraines    Tonsillar calculus     Surgical History: Past Surgical History:  Procedure Laterality Date   BIOPSY  10/15/2021   Procedure: BIOPSY;  Surgeon: Cindie Carlin POUR, DO;  Location: [facility];  Service: Endoscopy;;   COLONOSCOPY WITH PROPOFOL  N/A 10/15/2021   Procedure: COLONOSCOPY WITH PROPOFOL ;  Surgeon: Cindie Carlin POUR, DO;  Location: [facility];  Service: Endoscopy;  Laterality: N/A;  1245   ESOPHAGOGASTRODUODENOSCOPY (EGD) WITH PROPOFOL  N/A 10/15/2021   Procedure: ESOPHAGOGASTRODUODENOSCOPY (EGD) WITH PROPOFOL ;  Surgeon: Cindie Carlin POUR, DO;  Location: [facility];  Service: Endoscopy;  Laterality: N/A;   None     POLYPECTOMY  10/15/2021   Procedure: POLYPECTOMY;  Surgeon: Cindie Carlin POUR, DO;  Location: [facility];  Service: Endoscopy;;   TONSILLECTOMY Bilateral 06/17/2022   Procedure: TONSILLECTOMY;  Surgeon: Jesus Oliphant, MD;  Location: [facility];  Service: ENT;  Laterality: Bilateral;    Home Medications:  Allergies as of 03/16/2024       Reactions   Hydrocodone -acetaminophen  Other (See Comments)   Hallucination   Levaquin [levofloxacin] Rash        Medication List        Accurate as of March 15, 2024  8:15 AM. If you have any questions, ask your nurse or doctor.           acetaminophen  500 MG tablet Commonly known as: TYLENOL  Take 1,000 mg by mouth every 6 (six) hours as needed for moderate pain.   albuterol 108 (90 Base) MCG/ACT inhaler Commonly known as: VENTOLIN HFA Inhale 2 puffs into the lungs every 6 (six) hours as needed for wheezing.   amoxicillin-clavulanate 500-125 MG tablet Commonly known as: AUGMENTIN Take 500 mg by mouth in the morning and at bedtime.   cetirizine 10 MG tablet Commonly known as: ZYRTEC Take 10 mg by mouth daily as needed for allergies.   cyclobenzaprine 10 MG tablet Commonly known as: FLEXERIL Take 10 mg by mouth at bedtime.   ibuprofen  800 MG tablet Commonly known as: ADVIL  Take 1 tablet (800 mg total) by mouth every 6 (six) hours as needed for moderate pain.   LORazepam  0.5 MG tablet Commonly known as: Ativan  Take 0.5 tablets (0.25 mg total) by mouth 2 (two) times daily as needed for anxiety.   montelukast 10 MG tablet Commonly known as: SINGULAIR Take 10 mg by mouth daily.   Nurtec 75 MG Tbdp Generic drug: Rimegepant Sulfate Take 1 tablet (75 mg total) by mouth daily as needed. For migraines. Take as close to onset of migraine as possible. One daily maximum.   pantoprazole  40 MG tablet Commonly known as: Protonix  Take 1 tablet (40 mg total) by mouth 2 (two) times daily before a meal.   rizatriptan  10 MG disintegrating tablet Commonly  known as: MAXALT -MLT Take 1 tablet (10 mg total) by mouth as needed for migraine. May repeat in 2 hours if needed   sertraline 25 MG tablet Commonly known as: ZOLOFT Take 25 mg by mouth daily.        Allergies:  Allergies  Allergen Reactions   Hydrocodone -Acetaminophen  Other (See Comments)    Hallucination   Levaquin [Levofloxacin] Rash    Family History: Family History  Problem Relation Age of Onset   Throat cancer Father    Colon cancer Maternal Aunt        passed away at age 69    Colon polyps Neg Hx    Migraines Neg Hx     Social History:   reports that he has never smoked. He has never been exposed to tobacco smoke. He has never used smokeless tobacco. He reports current alcohol use. He reports that he does not currently use drugs.  ROS: All other review of systems were reviewed and are negative except what is noted above in HPI  Physical Exam: There were no vitals taken for this visit.  Constitutional:  Alert and oriented, No acute distress. HEENT: [place] AT, moist mucus membranes.  Trachea midline, no masses. Cardiovascular: No clubbing, cyanosis, or edema. Respiratory: Normal respiratory effort, no increased work of breathing. GI: No inguinal hernias GU: Normal phallus.  Scrotal skin normal.  Tiny right hydrocele.  Testicles normal bilaterally. Lymph: No cervical or inguinal lymphadenopathy. Skin: No rashes, bruises or suspicious lesions. Neurologic: Grossly intact, no focal deficits, moving all 4 extremities. Psychiatric: Normal mood and affect.  Laboratory Data: Lab Results  Component Value Date   WBC 17.3 (H) 10/17/2023   HGB 15.4 10/17/2023   HCT 45.4 10/17/2023   MCV 80.5 10/17/2023   PLT 361 10/17/2023    Lab Results  Component Value Date   CREATININE 1.49 (H) 10/17/2023    No results found for: PSA  No results found for: TESTOSTERONE  No results found for: HGBA1C  Urinalysis   Pertinent Imaging: I reviewed the scrotal ultrasound transcription, also, reviewed CT images and transcription     "

## 2024-03-16 ENCOUNTER — Ambulatory Visit: Admitting: Urology

## 2024-03-16 DIAGNOSIS — N433 Hydrocele, unspecified: Secondary | ICD-10-CM
